# Patient Record
Sex: Female | Born: 1965 | Race: White | Hispanic: No | Smoking: Never smoker
Health system: Southern US, Community
[De-identification: ages and names within clinical notes are randomized; demographics above are authoritative.]

---

## 2000-07-16 ENCOUNTER — Other Ambulatory Visit: Admission: RE | Admit: 2000-07-16 | Discharge: 2000-07-16 | Payer: Self-pay | Admitting: Obstetrics and Gynecology

## 2001-12-14 ENCOUNTER — Ambulatory Visit (HOSPITAL_COMMUNITY): Admission: RE | Admit: 2001-12-14 | Discharge: 2001-12-14 | Payer: Self-pay | Admitting: Family Medicine

## 2001-12-14 ENCOUNTER — Encounter: Payer: Self-pay | Admitting: Family Medicine

## 2002-04-12 ENCOUNTER — Ambulatory Visit (HOSPITAL_COMMUNITY): Admission: RE | Admit: 2002-04-12 | Discharge: 2002-04-12 | Payer: Self-pay | Admitting: Obstetrics and Gynecology

## 2002-08-20 ENCOUNTER — Emergency Department (HOSPITAL_COMMUNITY): Admission: EM | Admit: 2002-08-20 | Discharge: 2002-08-20 | Payer: Self-pay | Admitting: *Deleted

## 2003-02-17 ENCOUNTER — Inpatient Hospital Stay (HOSPITAL_COMMUNITY): Admission: RE | Admit: 2003-02-17 | Discharge: 2003-02-20 | Payer: Self-pay | Admitting: Obstetrics and Gynecology

## 2015-03-30 ENCOUNTER — Emergency Department (HOSPITAL_COMMUNITY): Payer: BC Managed Care – PPO

## 2015-03-30 ENCOUNTER — Emergency Department (HOSPITAL_COMMUNITY)
Admission: EM | Admit: 2015-03-30 | Discharge: 2015-03-30 | Disposition: A | Discharge status: Home / Self Care | Payer: BC Managed Care – PPO | Attending: Emergency Medicine | Admitting: Emergency Medicine

## 2015-03-30 ENCOUNTER — Encounter (HOSPITAL_COMMUNITY): Payer: Self-pay | Admitting: Emergency Medicine

## 2015-03-30 DIAGNOSIS — R202 Paresthesia of skin: Secondary | ICD-10-CM | POA: Diagnosis not present

## 2015-03-30 DIAGNOSIS — Z880 Allergy status to penicillin: Secondary | ICD-10-CM | POA: Diagnosis not present

## 2015-03-30 DIAGNOSIS — R200 Anesthesia of skin: Secondary | ICD-10-CM | POA: Diagnosis present

## 2015-03-30 DIAGNOSIS — Z79899 Other long term (current) drug therapy: Secondary | ICD-10-CM | POA: Insufficient documentation

## 2015-03-30 DIAGNOSIS — R42 Dizziness and giddiness: Secondary | ICD-10-CM | POA: Insufficient documentation

## 2015-03-30 LAB — BASIC METABOLIC PANEL
Anion gap: 7 (ref 5–15)
BUN: 15 mg/dL (ref 6–20)
CALCIUM: 9.2 mg/dL (ref 8.9–10.3)
CO2: 28 mmol/L (ref 22–32)
CREATININE: 0.64 mg/dL (ref 0.44–1.00)
Chloride: 103 mmol/L (ref 101–111)
GFR calc non Af Amer: >60 mL/min (ref >60)
GLUCOSE: 91 mg/dL (ref 65–99)
Potassium: 4.2 mmol/L (ref 3.5–5.1)
Sodium: 138 mmol/L (ref 135–145)

## 2015-03-30 LAB — URINALYSIS, ROUTINE W REFLEX MICROSCOPIC
BILIRUBIN URINE: NEGATIVE
Glucose, UA: NEGATIVE mg/dL
Hgb urine dipstick: NEGATIVE
KETONES UR: NEGATIVE mg/dL
NITRITE: NEGATIVE
Protein, ur: NEGATIVE mg/dL
pH: 6 (ref 5.0–8.0)

## 2015-03-30 LAB — URINE MICROSCOPIC-ADD ON: RBC / HPF: NONE SEEN RBC/hpf (ref 0–5)

## 2015-03-30 LAB — CBC
HEMATOCRIT: 40.5 % (ref 36.0–46.0)
Hemoglobin: 13.4 g/dL (ref 12.0–15.0)
MCH: 30.6 pg (ref 26.0–34.0)
MCHC: 33.1 g/dL (ref 30.0–36.0)
MCV: 92.5 fL (ref 78.0–100.0)
Platelets: 182 10*3/uL (ref 150–400)
RBC: 4.38 MIL/uL (ref 3.87–5.11)
RDW: 14.1 % (ref 11.5–15.5)
WBC: 6.5 10*3/uL (ref 4.0–10.5)

## 2015-03-30 LAB — TROPONIN I: Troponin I: <0.03 ng/mL (ref <0.031)

## 2015-03-30 MED ORDER — SODIUM CHLORIDE 0.9 % IV BOLUS (SEPSIS)
1000.0000 mL | Freq: Once | INTRAVENOUS | Status: AC
  Administered 2015-03-30: 1000 mL via INTRAVENOUS

## 2015-03-30 MED ORDER — MECLIZINE HCL 12.5 MG PO TABS
25.0000 mg | ORAL_TABLET | Freq: Once | ORAL | Status: AC
  Administered 2015-03-30: 25 mg via ORAL
  Filled 2015-03-30: qty 2

## 2015-03-30 MED ORDER — MECLIZINE HCL 25 MG PO TABS: 25.0000 mg | ORAL_TABLET | Freq: Three times a day (TID) | ORAL | Status: DC | PRN

## 2015-03-30 NOTE — ED Notes (Signed)
SBP 99. EDP aware. Additional liter bolus initiated.

## 2015-03-30 NOTE — ED Notes (Signed)
Pt ambulated in the hall well with no assistance aprox. 15 feet and back to room.

## 2015-03-30 NOTE — ED Notes (Signed)
Pt c/o some dizziness for last couple days but no dizziness today.

## 2015-03-30 NOTE — Discharge Instructions (Signed)
°Emergency Department Resource Guide °1) Find a Doctor and Pay Out of Pocket °Although you won't have to find out who is covered by your insurance plan, it is a good idea to ask around and get recommendations. You will then need to call the office and see if the doctor you have chosen will accept you as a new patient and what types of options they offer for patients who are self-pay. Some doctors offer discounts or will set up payment plans for their patients who do not have insurance, but you will need to ask so you aren't surprised when you get to your appointment. ° °2) Contact Your Local Health Department °Not all health departments have doctors that can see patients for sick visits, but many do, so it is worth a call to see if yours does. If you don't know where your local health department is, you can check in your phone book. The CDC also has a tool to help you locate your state's health department, and many state websites also have listings of all of their local health departments. ° °3) Find a Walk-in Clinic °If your illness is not likely to be very severe or complicated, you may want to try a walk in clinic. These are popping up all over the country in pharmacies, drugstores, and shopping centers. They're usually staffed by nurse practitioners or physician assistants that have been trained to treat common illnesses and complaints. They're usually fairly quick and inexpensive. However, if you have serious medical issues or chronic medical problems, these are probably not your best option. ° °No Primary Care Doctor: °- Call [facility] at  832-8000 - they can help you locate a primary care doctor that  accepts your insurance, provides certain services, etc. °- Physician Referral Service- 1-800-533-3463 ° °Chronic Pain Problems: °Organization         Address  Phone   Notes  °[facility]  (336) 297-2271 Patients need to be referred by their primary care doctor.  ° °Medication  Assistance: °Organization         Address  Phone   Notes  °[place] [facility] [address] °[place], [place] [address] (336) 641-8030 --Must be a resident of [place] °-- Must have NO insurance coverage whatsoever (no Medicaid/ Medicare, etc.) °-- The pt. MUST have a primary care doctor that directs their care regularly and follows them in the community °  °[facility]  (866) 331-1348   °[employer]  (888) 892-1162   ° °Agencies that provide inexpensive medical care: °Organization         Address  Phone   Notes  °[facility]  (336) 832-8035   °[facility]    (336) 832-7272   °[facility] [address] °[place], [place] [address] (336) 832-4777   °[facility] [address], °[place] (336) 271-4999   °[facility]    (336) 373-0678   °[facility]    (336) 272-1050   °[facility] ° [address], [place] Phone:  (336) 832-4444, Fax:  (336) 832-4440 Hours of Operation:  9 am - 6 pm, M-F.  Also accepts Medicaid/Medicare and self-pay.  °[facility] ° [address], [place] Phone: (336) [phone], Fax: (336) [phone]. Hours of Operation:  8:30 am - 5:30 pm, M-F.  Also accepts Medicaid and self-pay.  °[facility] [place] [address], [place] Phone: (336) 878-6027   °[facility] [address], [place], [place] (336)723-1848, Ext. 123 Mondays & Thursdays: 7-9 AM.  First 15 patients are seen on a first come, first serve basis. °  ° °Medicaid-accepting [place] Providers: ° °Organization         Address  Phone   Notes  °[facility] [address], [place] (336) 641-2100 Also accepts self-pay patients.  °[facility] [address], [place] ° (336) 856-9996   °[facility] [address], [place]  (336) 288-8857   °[facility] [address] [place] [address], [place] (336) 299-7000   °Veita Bland [address], [place]  ° (336) 373-1557 Only accepts [place] Access Medicaid patients after they have their name applied to their card.  ° °Self-Pay (no insurance) in [place]: ° °Organization         Address  Phone   Notes  °Sickle Cell Patients, [facility] [address], [place] (336) 832-1970   °[facility] [address], [place] (336) 832-4400   °[facility] ° [address], [place] (336) 992-4800   °[facility]/Dr. Osei-Bonsu ° [address] [place] [address], [place] or [address], [place] (336) [phone] Phone number for both [place] and [place] locations is the same.  °[facility] [address], [place] (336) 299-0000   °[facility] [address] [place] [address], [place] or [address] (336) 852-7530 °(336) 878-2260   °[facility] [address], [place] (336) 350-1642, phone; (336) 294-5005, fax Sees patients 1st and 3rd Saturday of every month.  Must not qualify for public or private insurance (i.e. Medicaid, Medicare, [place] Health Choice, Veterans' Benefits) • Household income should be no more than 200% of the poverty level •The clinic cannot treat you if you are pregnant or think you are pregnant • Sexually transmitted diseases are not treated at the clinic.  ° ° °Dental Care: °Organization         Address  Phone  Notes  °[place] [facility] [address], [place] (336) 641-6152 Accepts children up to age 21 who are enrolled in Medicaid or [place] Health Choice; pregnant women with a Medicaid card; and children who have applied for Medicaid or [place] Health Choice, but were declined, whose parents can pay a reduced fee at time of service.  °[place]  [facility] [place]  [address], [place] (336) 641-7733 Accepts children up to age 21 who are enrolled in Medicaid or [place] Health Choice; pregnant women with a Medicaid card; and children who have applied for Medicaid or [place] Health Choice, but were declined, whose parents can pay a reduced fee at time of service.  °[facility] ° [address], [place] (336) 641-4533 Patients are seen by appointment only. Walk-ins are not accepted. [facility] will see patients 18 years of age and older. °Monday - Tuesday (8am-5pm) °Most Wednesdays (8:30-5pm) °$30 per visit, cash only  °[facility] ° [address], [place] (336) 641-4533 Patients are seen by appointment only. Walk-ins are not accepted. [facility] will see patients 18 years of age and older. °One   Wednesday Evening (Monthly: Volunteer Based).  $30 per visit, cash only  °[facility]  (919) 537-3737 for adults; Children under age 4, call Graduate Pediatric Dentistry at ([phone]) [phone]. Children aged 4-14, please call ([phone]) [phone] to request a pediatric application. ° Dental services are provided in all areas of dental care including fillings, crowns and bridges, complete and partial dentures, implants, gum treatment, root canals, and extractions. Preventive care is also provided. Treatment is provided to both adults and children. °Patients are selected via a lottery and there is often a waiting list. °  °[facility] [address], °[place] ° (336) 763-8833 [email or web address] °  °[facility] [address], [place], [place] (336)723-1848, Ext. 123 Second and Fourth Thursday of each month, opens at 6:30 AM; Clinic ends at 9 AM.  Patients are seen on a first-come first-served basis, and a limited number are seen during each clinic.  ° °[facility] ° [address], [place], [place] (336) 723-7904    Eligibility Requirements °You must have lived in [place], [place], or [place] counties for at least the last three months. °  You cannot be eligible for state or federal sponsored healthcare insurance, including Veterans Administration, Medicaid, or Medicare. °  You generally cannot be eligible for healthcare insurance through your employer.  °  How to apply: °Eligibility screenings are held every Tuesday and Wednesday afternoon from 1:00 pm until 4:00 pm. You do not need an appointment for the interview!  °[facility] [address], [place], [place] 336-631-2330   °[facility]  336-342-8273   °[facility]  336-703-3100   °[facility]  336-570-6415   ° °Behavioral Health Resources in the Community: °Intensive Outpatient Programs °Organization         Address  Phone  Notes  °[facility] [address], [place], [place] 336-878-6098   °[facility] [address], [place], [place] 336-832-9800   °[facility] [address], [place], [place] ° 336-882-2125   °[facility] [address],  °[place], [place] 1-800-853-5163 or 336-641-4981   °Substance Abuse Resources °Organization         Address  Phone  Notes  °[facility]  336-882-2125   °[facility]  336-784-9470   °[facility]  336-285-9073   °[facility]  336-845-3988   °Residential & Outpatient Substance Abuse Program  1-800-659-3381   °Psychological Services °Organization         Address  Phone  Notes  °[facility]  336- 832-9600   °[facility]  336- 378-7881   °[facility] [address], [place] 1-800-853-5163 or 336-641-4981   ° °Mobile Crisis Teams °Organization         Address  Phone  Notes  °[facility], Mobile Crisis Care Unit  1-877-626-1772   °Assertive °[facility] ° [address]  [place], [place] 336-834-9664   °Sharon DeEsch [address] °[place] 336-554-5454   ° °Self-Help/Support Groups °Organization         Address  Phone             Notes  °[facility] of  - variety of support groups  336- 373-1402 Call for more information  °Narcotics Anonymous (NA), [facility] [address], °[place]  2 meetings at this location  ° °  Residential Treatment Programs Organization         Address  Phone  Notes  [facility] [address],    [place]  [phone]   [facility]  [address], [place] [phone], [place], [place] [phone]   [facility] [address] [place], [place] [phone] Admissions: 8am-3pm M-F  [facility] [address]    [place], [place] [phone]   [facility] [address], [place], [place] [phone]   [facility] [address]  [place], [place] [phone]   [facility] Intensive Outpatient [address], [place], [place] [phone]   [facility] [address]  [place], [place] [phone] or [phone]   [facility] [address] [place], [place] [phone] Accepts Medicaid  [facility] [address]  [place] [phone] Substance Abuse/Addiction Treatment   [facility]         Address  Phone  Notes  [facility]  [phone](888) (610)[phone]   Angie FavaJulie Brannon, PhD [address], [address] [place], [place]   ([phone](336) [phone] or [phone](336) [phone]   [facility]   [address] [place], [place] [phone](336) [phone]   [facility] [address], [place], [place] (606) [phone](336) [phone] Insurance/Medicaid/sponsorship through [facility] [address]                                    [place], [place] [phone](336) [phone] Therapy/tele-psych/case    [facility] [address]   [place], [place] [phone](336) [phone]    Dr. Lolly MustacheArfeen  [phone](336) [phone]   [facility] [place]  [employer] [facility] 1) [address], [place] 2) [address], [place] 3)  [address], [place] [phone](336) [phone]) [phone]  [phone](336) [phone]   [facility] Child Abuse Hotline [phone](336) [phone] or [phone](336) ([phone] (After Hours)      Take the prescription as directed.  Call your regular medical doctor and the ENT on Monday to schedule a follow up appointment within the next week. Call the Vascular doctor on Monday to schedule a follow up appointment within the next week regarding your finger sensation changes.  Return to the Emergency Department immediately sooner if worsening.

## 2015-03-30 NOTE — ED Notes (Addendum)
Pt reports right hand went cold and "white" 30 minutes ago. Pt still has tingling in right ring finger.  Pt alert and oriented. Pt has been dizzy for two days.

## 2015-03-30 NOTE — ED Notes (Addendum)
Pt states she has noticed that she has had 2-3 episodes of dizziness over the past 2-3 days, described as episodes of unsteadiness / off-balance. States she has checked her BP and it was low, unable to recall exactly what it was but states SBP was never below 100. Pt states 30 min. PTA arrival, her right ring finger became pale and was tingling which alarmed her. Pt is currently tearful, stating she is scared.

## 2015-03-30 NOTE — ED Provider Notes (Signed)
CSN: [id number]     Arrival date & time 03/30/15  1136 History   First MD Initiated Contact with Patient 03/30/15 1217     Chief Complaint  Patient presents with  . Numbness  . Dizziness      HPI  Pt was seen at 1225. Per pt, c/o sudden onset and resolution of multiple intermittent episodes of "dizziness" that began 2 to 3 days ago. Pt describes the dizziness as "everything was moving around" and "feeling off balance." Symptoms worsen when she moves her eyes side to side. Pt also c/o her right ring finger "got pale and numb and cold" 30 min PTA. Pt states this lasted several seconds to minute before resolving. Denies CP/palpitations, no SOB/cough, no abd pain, no N/V/D, no visual changes, no focal motor weakness, no other tingling/numbness in extremities, no ataxia, no slurred speech, no facial droop.    No past medical history on file.   No past surgical history on file.  Social History  Substance Use Topics  . Smoking status: Never Smoker   . Smokeless tobacco: Not on file  . Alcohol Use: No    Review of Systems ROS: Statement: All systems negative except as marked or noted in the HPI; Constitutional: Negative for fever and chills. ; ; Eyes: Negative for eye pain, redness and discharge. ; ; ENMT: Negative for ear pain, hoarseness, nasal congestion, sinus pressure and sore throat. ; ; Cardiovascular: Negative for chest pain, palpitations, diaphoresis, dyspnea and peripheral edema. ; ; Respiratory: Negative for cough, wheezing and stridor. ; ; Gastrointestinal: Negative for nausea, vomiting, diarrhea, abdominal pain, blood in stool, hematemesis, jaundice and rectal bleeding. . ; ; Genitourinary: Negative for dysuria, flank pain and hematuria. ; ; Musculoskeletal: Negative for back pain and neck pain. Negative for swelling and trauma.; ; Skin: Negative for pruritus, rash, abrasions, blisters, bruising and skin lesion.; ; Neuro: +paresthesia, "dizziness." Negative for headache,  lightheadedness and neck stiffness. Negative for weakness, altered level of consciousness , altered mental status, extremity weakness, involuntary movement, seizure and syncope.      Allergies  Penicillins  Home Medications   Prior to Admission medications   Medication Sig Start Date End Date Taking? Authorizing Provider  Multiple Vitamins-Minerals (ONE-A-DAY WOMENS 50+ ADVANTAGE) TABS Take 1 Package by mouth daily.   Yes Historical Provider, MD  sodium chloride (OCEAN) 0.65 % SOLN nasal spray Place 1 spray into both nostrils as needed for congestion.   Yes Historical Provider, MD   BP 131/64 mmHg  Pulse 60  Temp(Src) 97.5 F (36.4 C) (Oral)  Resp 18  Ht  (1.727 m)  Wt 150 lb (68.04 kg)  BMI 22.81 kg/m2  SpO2 100%  LMP 01/29/2015 (Approximate)   12:59 Orthostatic Vital Signs KP  Orthostatic Lying  - BP- Lying: 116/64 mmHg ; Pulse- Lying: 57  Orthostatic Sitting - BP- Sitting: 115/65 mmHg ; Pulse- Sitting: 56  Orthostatic Standing at 0 minutes - BP- Standing at 0 minutes: 102/70 mmHg ; Pulse- Standing at 0 minutes: 71      Physical Exam 1230: Physical examination:  Nursing notes reviewed; Vital signs and O2 SAT reviewed;  Constitutional: Well developed, Well nourished, Well hydrated, In no acute distress; Head:  Normocephalic, atraumatic; Eyes: EOMI, PERRL, No scleral icterus; ENMT: TM's clear bilat. +edemetous nasal turbinates bilat with clear rhinorrhea. Mouth and pharynx normal, Mucous membranes moist; Neck: Supple, Full range of motion, No lymphadenopathy; Cardiovascular: Regular rate and rhythm, No murmur, rub, or gallop; Respiratory: Breath sounds clear &  equal bilaterally, No rales, rhonchi, wheezes.  Speaking full sentences with ease, Normal respiratory effort/excursion; Chest: Nontender, Movement normal; Abdomen: Soft, Nontender, Nondistended, Normal bowel sounds; Genitourinary: No CVA tenderness; Extremities: Pulses normal, +entire right ring finger warm with good  color, brisk cap refill in fingertip, FROM, NT to palp. No deformity, no rash, no edema, No calf edema or asymmetry.; Neuro: AA&Ox3, Major CN grossly intact. Speech clear.  No facial droop.  +right horizontal end gaze fatigable nystagmus. Grips equal. Strength 5/5 equal bilat UE's and LE's.  DTR 2/4 equal bilat UE's and LE's.  No gross sensory deficits.  Normal cerebellar testing bilat UE's (finger-nose) and LE's (heel-shin)..; Skin: Color normal, Warm, Dry.; Psych:  Anxious.    ED Course  Procedures (including critical care time) Labs Review   Imaging Review  I have personally reviewed and evaluated these images and lab results as part of my medical decision-making.   EKG Interpretation   Date/Time:  Friday March 30 2015 11:54:05 EST Ventricular Rate:  63 PR Interval:  126 QRS Duration: 108 QT Interval:  417 QTC Calculation: 427 R Axis:   68 Text Interpretation:  Sinus rhythm Atrial premature complex No old tracing  to compare Confirmed by [facility]  MD, Nicholos Johns [phone]) on 03/30/2015  12:38:40 PM      MDM  MDM Reviewed: previous chart, nursing note and vitals Reviewed previous: labs and ECG Interpretation: labs, ECG and CT scan      Results for orders placed or performed during the hospital encounter of 03/30/15  CBC  Result Value Ref Range   WBC 6.5 4.0 - 10.5 K/uL   RBC 4.38 3.87 - 5.11 MIL/uL   Hemoglobin 13.4 12.0 - 15.0 g/dL   HCT 30.8 65.7 - 84.6 %   MCV 92.5 78.0 - 100.0 fL   MCH 30.6 26.0 - 34.0 pg   MCHC 33.1 30.0 - 36.0 g/dL   RDW 96.2 95.2 - 84.1 %   Platelets 182 150 - 400 K/uL  Basic metabolic panel  Result Value Ref Range   Sodium 138 135 - 145 mmol/L   Potassium 4.2 3.5 - 5.1 mmol/L   Chloride 103 101 - 111 mmol/L   CO2 28 22 - 32 mmol/L   Glucose, Bld 91 65 - 99 mg/dL   BUN 15 6 - 20 mg/dL   Creatinine, Ser 3.24 0.44 - 1.00 mg/dL   Calcium 9.2 8.9 - 40.1 mg/dL   GFR calc non Af Amer >60 >60 mL/min   GFR calc Af Amer >60 >60 mL/min    Anion gap 7 5 - 15  Troponin I  Result Value Ref Range   Troponin I <0.03 <0.031 ng/mL  Urinalysis, Routine w reflex microscopic  Result Value Ref Range   Color, Urine YELLOW YELLOW   APPearance HAZY (A) CLEAR   Specific Gravity, Urine <1.005 (L) 1.005 - 1.030   pH 6.0 5.0 - 8.0   Glucose, UA NEGATIVE NEGATIVE mg/dL   Hgb urine dipstick NEGATIVE NEGATIVE   Bilirubin Urine NEGATIVE NEGATIVE   Ketones, ur NEGATIVE NEGATIVE mg/dL   Protein, ur NEGATIVE NEGATIVE mg/dL   Nitrite NEGATIVE NEGATIVE   Leukocytes, UA MODERATE (A) NEGATIVE  Urine microscopic-add on  Result Value Ref Range   Squamous Epithelial / LPF 6-30 (A) NONE SEEN   WBC, UA 6-30 0 - 5 WBC/hpf   RBC / HPF NONE SEEN 0 - 5 RBC/hpf   Bacteria, UA RARE (A) NONE SEEN   Ct Head Wo Contrast 03/30/2015  CLINICAL DATA:  Dizziness for 3 days. EXAM: CT HEAD WITHOUT CONTRAST TECHNIQUE: Contiguous axial images were obtained from the base of the skull through the vertex without intravenous contrast. COMPARISON:  December 14, 2001 FINDINGS: The ventricles are normal in size and configuration. There is no intracranial mass, hemorrhage, extra-axial fluid collection, or midline shift. Gray-white compartments are normal. No acute infarct evident. The bony calvarium appears intact. The mastoid air cells are clear. Visualized intraorbital structures appear symmetric bilaterally. IMPRESSION: Study within normal limits. Electronically Signed   By: Bretta BangWilliam  Woodruff III M.D.   On: 03/30/2015 13:42    1450:  Pt orthostatic on VS; IVF given. Pt tol PO well without N/V. Pt has ambulated with steady gait, easy resps, NAD. Right ring finger continues warm/good color/brisk cap refill in tip. Pt feels better after antivert and wants to go home now. Tx symptomatically.  Dx and testing d/w pt and family.  Questions answered.  Verb understanding, agreeable to d/c home with outpt f/u.    Samuel JesterKathleen Lenzi Marmo, DO 04/01/15 1221

## 2015-04-03 ENCOUNTER — Other Ambulatory Visit (HOSPITAL_COMMUNITY): Payer: Self-pay | Admitting: Family Medicine

## 2015-04-03 DIAGNOSIS — Z1231 Encounter for screening mammogram for malignant neoplasm of breast: Secondary | ICD-10-CM

## 2015-04-11 ENCOUNTER — Ambulatory Visit (HOSPITAL_COMMUNITY): Payer: BC Managed Care – PPO

## 2015-04-23 ENCOUNTER — Other Ambulatory Visit: Payer: Self-pay

## 2015-04-23 DIAGNOSIS — R202 Paresthesia of skin: Secondary | ICD-10-CM

## 2015-05-09 ENCOUNTER — Encounter: Payer: Self-pay | Admitting: Vascular Surgery

## 2015-05-15 ENCOUNTER — Ambulatory Visit (HOSPITAL_COMMUNITY)
Admission: RE | Admit: 2015-05-15 | Discharge: 2015-05-15 | Disposition: A | Discharge status: Home / Self Care | Payer: BC Managed Care – PPO | Source: Ambulatory Visit | Attending: Vascular Surgery | Admitting: Vascular Surgery

## 2015-05-15 ENCOUNTER — Other Ambulatory Visit: Payer: Self-pay | Admitting: Vascular Surgery

## 2015-05-15 ENCOUNTER — Encounter: Payer: Self-pay | Admitting: Vascular Surgery

## 2015-05-15 ENCOUNTER — Ambulatory Visit (INDEPENDENT_AMBULATORY_CARE_PROVIDER_SITE_OTHER): Payer: BC Managed Care – PPO | Admitting: Vascular Surgery

## 2015-05-15 VITALS — BP 105/68 | HR 70 | Ht 68.0 in | Wt 157.0 lb

## 2015-05-15 DIAGNOSIS — R202 Paresthesia of skin: Secondary | ICD-10-CM

## 2015-05-15 DIAGNOSIS — M79644 Pain in right finger(s): Secondary | ICD-10-CM | POA: Diagnosis not present

## 2015-05-15 NOTE — Progress Notes (Signed)
Referring Physician: Dr. Margretta Sidle History of Present Illness:  Patient is a 50 y.o. year old female who presents for evaluation of Her right hand.  She has had 2 episodes of numbness and tingling with bluish skin discoloration of her 4th digit on her right hand since Christmas.  Other medical problems include  does not have a problem list on file.  History reviewed. No pertinent past medical history.  She denise DM, HTN, HLD, and CAD.    History reviewed. No pertinent past surgical history.  She has had no surgeries to date.  Social History Social History  Substance Use Topics  . Smoking status: Never Smoker   . Smokeless tobacco: None  . Alcohol Use: No    Family History History reviewed. No pertinent family history.  Allergies  Allergies  Allergen Reactions  . Penicillins Other (See Comments)    Childhood allergy. Has patient had a PCN reaction causing immediate rash, facial/tongue/throat swelling, SOB or lightheadedness with hypotension: No Has patient had a PCN reaction causing severe rash involving mucus membranes or skin necrosis: No Has patient had a PCN reaction that required hospitalization No Has patient had a PCN reaction occurring within the last 10 years: No If all of the above answers are "NO", then may proceed with Cephalosporin use.      Current Outpatient Prescriptions  Medication Sig Dispense Refill  . Multiple Vitamins-Minerals (ONE-A-DAY WOMENS 50+ ADVANTAGE) TABS Take 1 Package by mouth daily.    . sodium chloride (OCEAN) 0.65 % SOLN nasal spray Place 1 spray into both nostrils as needed for congestion.    . meclizine (ANTIVERT) 25 MG tablet Take 1 tablet (25 mg total) by mouth 3 (three) times daily as needed for dizziness. (Patient not taking: Reported on 05/15/2015) 15 tablet 0   No current facility-administered medications for this visit.    ROS:   General:  No weight loss, Fever, chills  HEENT: No recent headaches, no nasal bleeding,  no visual changes, no sore throat  Neurologic: No dizziness, blackouts, seizures. No recent symptoms of stroke or mini- stroke. No recent episodes of slurred speech, or temporary blindness.  Cardiac: No recent episodes of chest pain/pressure, no shortness of breath at rest.  No shortness of breath with exertion.  Denies history of atrial fibrillation or irregular heartbeat  Vascular: No history of rest pain in feet.  No history of claudication.  No history of non-healing ulcer, No history of DVT   Pulmonary: No home oxygen, no productive cough, no hemoptysis,  No asthma or wheezing  Musculoskeletal:   Arthritis,  Low back pain,   Joint pain  Hematologic:No history of hypercoagulable state.  No history of easy bleeding.  No history of anemia  Gastrointestinal: No hematochezia or melena,  No gastroesophageal reflux, no trouble swallowing  Urinary:  chronic Kidney disease,  on HD -  MWF or  TTHS,  Burning with urination,  Frequent urination,  Difficulty urinating;   Skin: No rashes  Psychological: No history of anxiety,  No history of depression   Physical Examination  Filed Vitals:   05/15/15 1409  BP: 105/68  Pulse: 70  Height:  (1.727 m)  Weight: 157 lb (71.215 kg)  SpO2: 100%    Body mass index is 23.88 kg/(m^2).  General:  Alert and oriented, no acute distress HEENT: Normal Neck: No bruit or JVD Pulmonary: Clear to auscultation bilaterally Cardiac: Regular Rate and  Rhythm without murmur Abdomen: Soft, non-tender, non-distended, no mass, no scars Skin: No rash, right hand all fingers appear normal in color with cap refill <2 sec., sensation intact.   Extremity Pulses:  2+ radial, brachial, femoral, dorsalis pedis, posterior tibial pulses bilaterally Musculoskeletal: No deformity or edema  Neurologic: Upper and lower extremity motor 5/5 and symmetric  DATA:   Arterial duplex ABI's Triphasic flow bilaterally with 4th digit pressures  noted to be normal.   ASSESSMENT:   Right ring finger 4th digit decreased sensation and cold bluish skin times 2 episodes  Normal triphasic arterial flow bilateral UE  PLAN:   She is not as risk of limb loss or digit loss.  Her arterial flow is completely normal on exam and via duplex study.   This could be explained by mechanical arterial vessel blockage, but the artrial duplex show no blockage.  There could have been 2 episodes of arterial spasm and complete recovery.  The last possibility to consider is Raynauds.  This usually would be affected by exposure to cold temps.  and would affect more than just one digit,  it would affect all 5 digits.  At this time she is not in any danger.  If she has progressive cold blue episodes we could per for an angiogram to look at there blood flow in the future.  She will F/U PRN.   Thomasena Edis EMMA [facility] PA-C [facility] [place] Office: [phone]  The patient was seen in conjunction with Dr. Arbie Cookey I have examined the patient, reviewed and agree with above. No evidence of embolic disease but also no evidence of vasospasm affecting specifically the fourth digit on her right hand. Noninvasive waveforms in all 10 digits completely normal. Explained the possibility of progressing to more standard ray knots phenomenon in the future. She was reassured and will see [place] should she develop recurrent problems. No evidence of repetitive motion trauma. Works as a Chartered loss adjuster.  Gretta Began, MD 05/15/2015 3:17 PM

## 2016-03-04 ENCOUNTER — Other Ambulatory Visit (HOSPITAL_COMMUNITY): Payer: Self-pay | Admitting: Family Medicine

## 2016-03-04 ENCOUNTER — Ambulatory Visit (HOSPITAL_COMMUNITY)
Admission: RE | Admit: 2016-03-04 | Discharge: 2016-03-04 | Disposition: A | Discharge status: Home / Self Care | Payer: BC Managed Care – PPO | Source: Ambulatory Visit | Attending: Family Medicine | Admitting: Family Medicine

## 2016-03-04 DIAGNOSIS — M545 Low back pain: Secondary | ICD-10-CM | POA: Diagnosis not present

## 2018-06-21 IMAGING — DX DG LUMBAR SPINE COMPLETE 4+V
5 series · 5 of 5 positions shown · non-contrast
Comparison: None.

CLINICAL DATA: Bilateral low back pain, no injury

EXAM:
LUMBAR SPINE - COMPLETE 4+ VIEW

[l-spine ap]
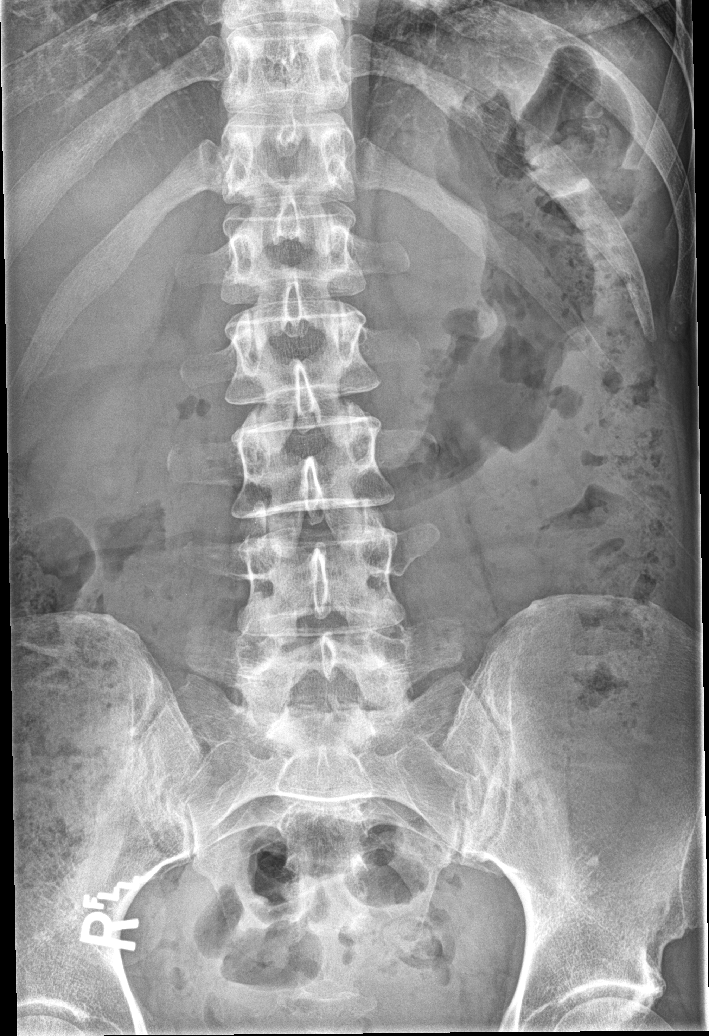

[l-spine lat]
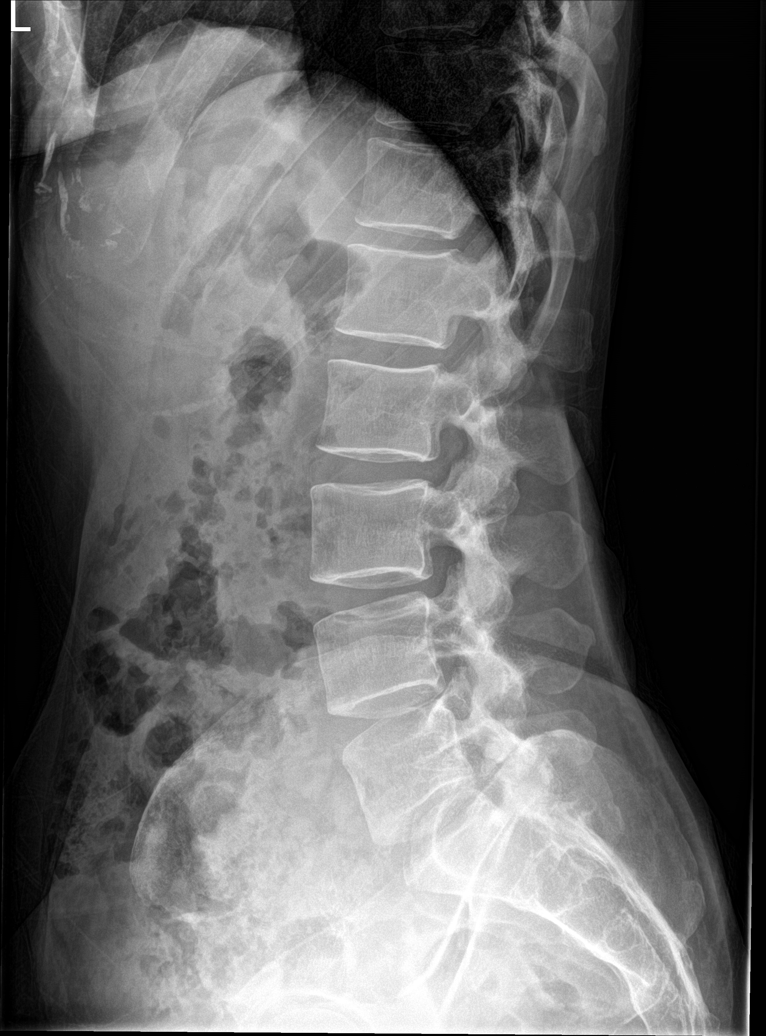

[l-spine spot]
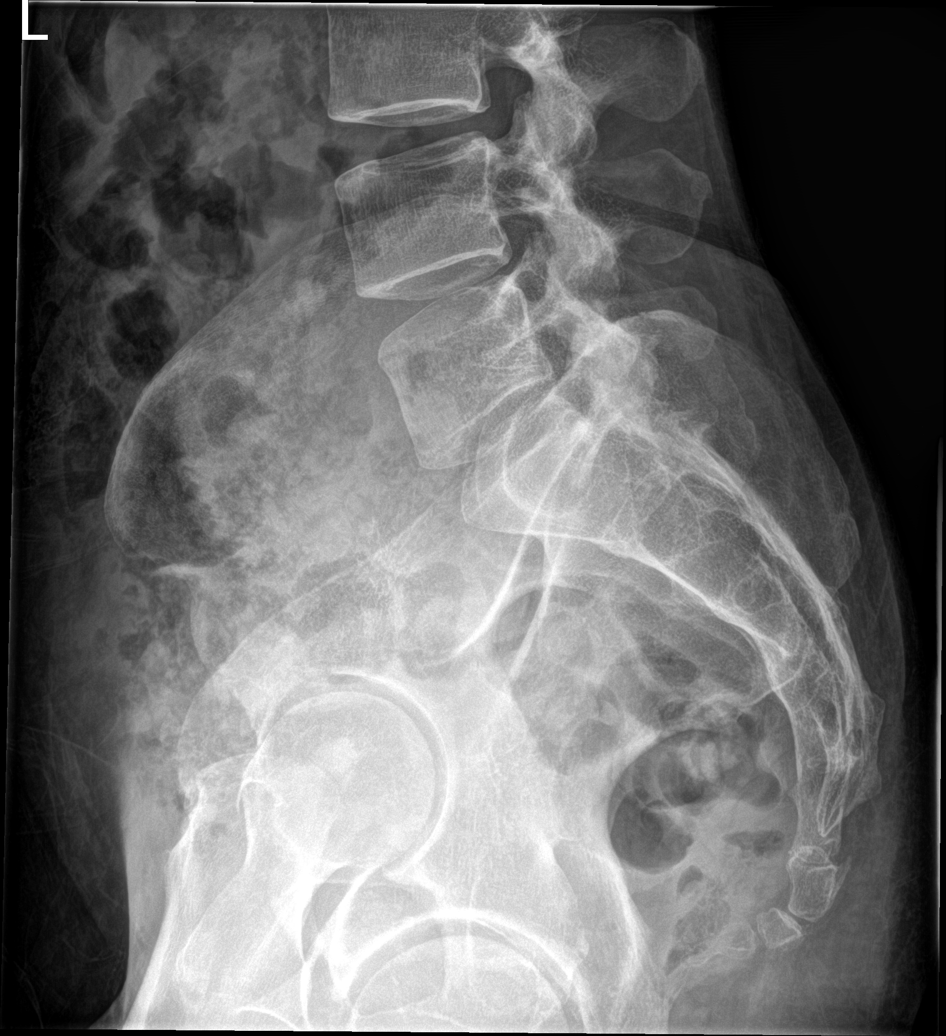

[l-spine obl (1 of 2)]
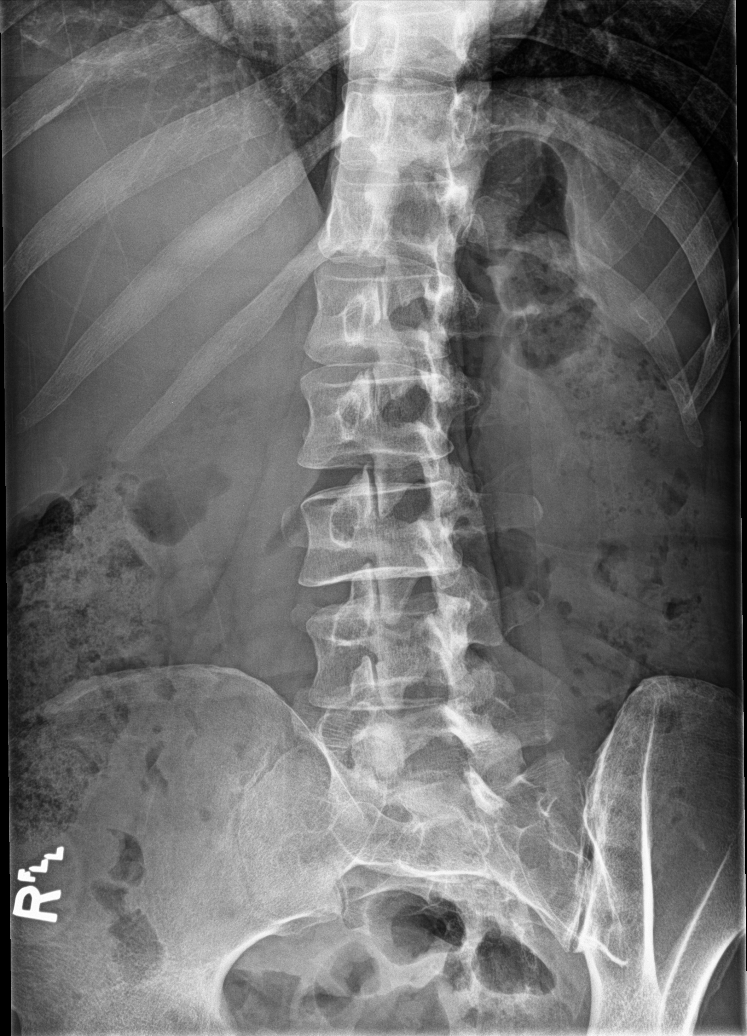

[l-spine obl (2 of 2)]
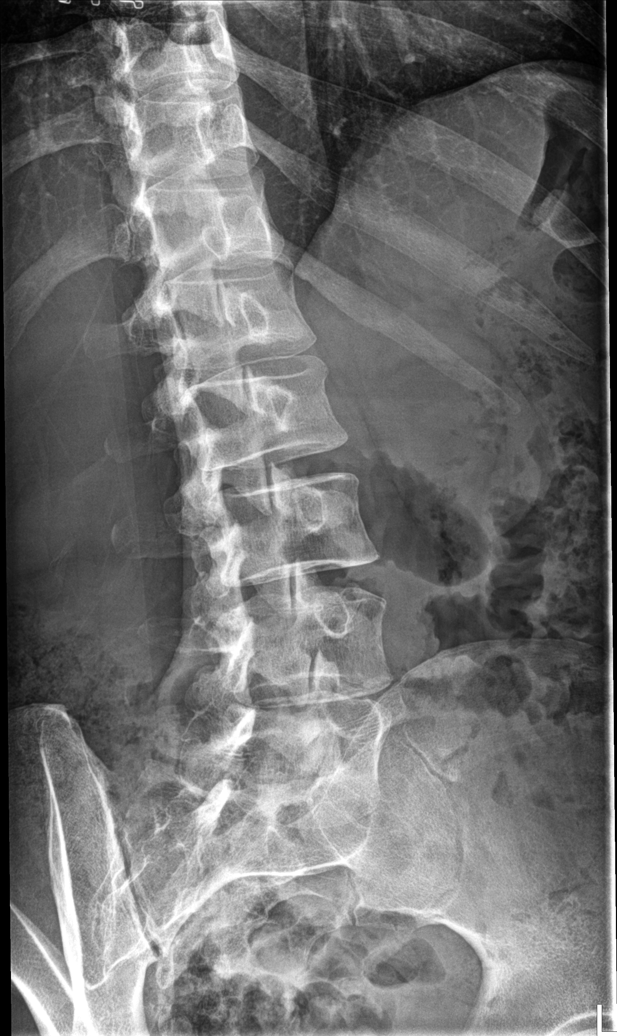

[5 of 5 positions shown; findings below may reference images not displayed]

FINDINGS: The lumbar vertebrae are in normal alignment. Intervertebral disc
spaces appear normal. No pars defect is seen. On oblique views the
facet joints are unremarkable. The SI joints are corticated. The
bowel gas pattern is nonspecific.
IMPRESSION: Normal alignment.  Normal intervertebral disc spaces.

## 2020-03-06 ENCOUNTER — Other Ambulatory Visit: Payer: Self-pay

## 2020-03-06 DIAGNOSIS — I8390 Asymptomatic varicose veins of unspecified lower extremity: Secondary | ICD-10-CM

## 2020-03-12 ENCOUNTER — Ambulatory Visit (HOSPITAL_COMMUNITY)
Admission: RE | Admit: 2020-03-12 | Discharge: 2020-03-12 | Disposition: A | Discharge status: Home / Self Care | Payer: BC Managed Care – PPO | Source: Ambulatory Visit | Attending: Surgery | Admitting: Surgery

## 2020-03-12 ENCOUNTER — Ambulatory Visit (INDEPENDENT_AMBULATORY_CARE_PROVIDER_SITE_OTHER): Payer: BC Managed Care – PPO | Admitting: Physician Assistant

## 2020-03-12 ENCOUNTER — Other Ambulatory Visit: Payer: Self-pay

## 2020-03-12 VITALS — BP 111/69 | HR 53 | Temp 98.9°F | Resp 20 | Ht 68.0 in | Wt 141.2 lb

## 2020-03-12 DIAGNOSIS — I8390 Asymptomatic varicose veins of unspecified lower extremity: Secondary | ICD-10-CM | POA: Diagnosis not present

## 2020-03-13 ENCOUNTER — Encounter: Payer: Self-pay | Admitting: Physician Assistant

## 2020-03-13 NOTE — Progress Notes (Signed)
Office Note     CC:  follow up Requesting Provider:  Gareth Morgan, MD  HPI: Christina Mullins is a 54 y.o. ([birth year]) female who presents for evaluation of varicose veins of bilateral lower extremities.  She was referred by PCP.  She is worried about her varicose veins.  These have been present over the past several years however believes they are getting worse.  She says her mother and her maternal grandmother also have varicose veins.  She denies any history of DVT, trauma, venous ulcerations, and prior vascular interventions.  She was prescribed compression stockings thigh-high by her PCP.  She was wearing these regularly however stopped recently because she was worried about the effects of the top of the compression stocking at the thigh digging into her varicosities.  She denies any episodes or diagnosis of superficial thrombophlebitis.  She has not tried elevating her feet during the day.  She denies tobacco use.   History reviewed. No pertinent past medical history.  History reviewed. No pertinent surgical history.  Social History   Socioeconomic History  . Marital status: Divorced    Spouse name: Not on file  . Number of children: Not on file  . Years of education: Not on file  . Highest education level: Not on file  Occupational History  . Not on file  Tobacco Use  . Smoking status: Never Smoker  Substance and Sexual Activity  . Alcohol use: No    Alcohol/week: 0.0 standard drinks  . Drug use: No  . Sexual activity: Not on file  Other Topics Concern  . Not on file  Social History Narrative  . Not on file   Social Determinants of Health   Financial Resource Strain:   . Difficulty of Paying Living Expenses: Not on file  Food Insecurity:   . Worried About Programme researcher, broadcasting/film/video in the Last Year: Not on file  . Ran Out of Food in the Last Year: Not on file  Transportation Needs:   . Lack of Transportation (Medical): Not on file  . Lack of Transportation  (Non-Medical): Not on file  Physical Activity:   . Days of Exercise per Week: Not on file  . Minutes of Exercise per Session: Not on file  Stress:   . Feeling of Stress : Not on file  Social Connections:   . Frequency of Communication with Friends and Family: Not on file  . Frequency of Social Gatherings with Friends and Family: Not on file  . Attends Religious Services: Not on file  . Active Member of Clubs or Organizations: Not on file  . Attends Banker Meetings: Not on file  . Marital Status: Not on file  Intimate Partner Violence:   . Fear of Current or Ex-Partner: Not on file  . Emotionally Abused: Not on file  . Physically Abused: Not on file  . Sexually Abused: Not on file   History reviewed. No pertinent family history.  Current Outpatient Medications  Medication Sig Dispense Refill  . Nutritional Supplements (IMMUNE ENHANCE PO) Take by mouth.    . Turmeric (QC TUMERIC COMPLEX) 500 MG CAPS Take by mouth.    Marland Kitchen UNABLE TO FIND Take by mouth daily. Med Name: Proargi-9+ L-arginine Complex     No current facility-administered medications for this visit.    Allergies  Allergen Reactions  . Penicillins Other (See Comments)    Childhood allergy. Has patient had a PCN reaction causing immediate rash, facial/tongue/throat swelling, SOB or lightheadedness with  hypotension: No Has patient had a PCN reaction causing severe rash involving mucus membranes or skin necrosis: No Has patient had a PCN reaction that required hospitalization No Has patient had a PCN reaction occurring within the last 10 years: No If all of the above answers are "NO", then may proceed with Cephalosporin use.      REVIEW OF SYSTEMS:   [X]  denotes positive finding, [ ]  denotes negative finding Cardiac  Comments:  Chest pain or chest pressure:    Shortness of breath upon exertion:    Short of breath when lying flat:    Irregular heart rhythm:        Vascular    Pain in calf, thigh, or  hip brought on by ambulation:    Pain in feet at night that wakes you up from your sleep:     Blood clot in your veins:    Leg swelling:         Pulmonary    Oxygen at home:    Productive cough:     Wheezing:         Neurologic    Sudden weakness in arms or legs:     Sudden numbness in arms or legs:     Sudden onset of difficulty speaking or slurred speech:    Temporary loss of vision in one eye:     Problems with dizziness:         Gastrointestinal    Blood in stool:     Vomited blood:         Genitourinary    Burning when urinating:     Blood in urine:        Psychiatric    Major depression:         Hematologic    Bleeding problems:    Problems with blood clotting too easily:        Skin    Rashes or ulcers:        Constitutional    Fever or chills:      PHYSICAL EXAMINATION:  Vitals:   03/12/20 1311  BP: 111/69  Pulse: (!) 53  Resp: 20  Temp: 98.9 F (37.2 C)  TempSrc: Temporal  SpO2: 100%  Weight: 141 lb 3.2 oz (64 kg)  Height: 5\' 8"  (1.727 m)    General:  WDWN in NAD; vital signs documented above Gait: Not observed HENT: WNL, normocephalic Pulmonary: normal non-labored breathing , without Rales, rhonchi,  wheezing Cardiac: regular HR Abdomen: soft, NT, no masses Skin: without rashes Vascular Exam/Pulses:  Right Left  Radial 2+ (normal) 2+ (normal)  DP 2+ (normal) 2+ (normal)  PT absent absent   Extremities: without ischemic changes, without Gangrene , without cellulitis; without open wounds;  Musculoskeletal: no muscle wasting or atrophy  Neurologic: A&O X 3;  No focal weakness or paresthesias are detected Psychiatric:  The pt has Normal affect.   Non-Invasive Vascular Imaging:   Bilateral venous reflux study R Negative for DVT Deep reflux noted at common femoral vein Superficial reflux noted at saphenofemoral junction and again at GSV proximal calf down to distal calf  L Negative for DVT Deep reflux noted at common femoral vein,  femoral vein, and popliteal vein Superficial reflux noted at GSV at the distal thigh   ASSESSMENT/PLAN:: 54 y.o. female here for evaluation of varicose veins of bilateral lower extremities   -Bilateral lower extremity venous reflux study negative for DVTs however demonstrates superficial reflux of right lower extremity involving the greater saphenous  vein as well as superficial reflux of left greater saphenous vein and deep reflux from common femoral vein down to popliteal vein -Despite reflux in bilateral greater saphenous veins the diameter of these veins is rather small less than 4 mm -Recommended patient continue wearing her thigh-high compression -Also recommended patient periodically elevate her legs during the day, avoid prolonged sitting and standing, and use NSAIDs for symptoms related to varicose veins -She has not had any episodes of superficial thrombophlebitis however will call/return to office if she experiences recurrent episodes and we could potentially consider stab phlebectomy.  Patient will otherwise follow-up on an as-needed basis   Emilie Rutter, PA-C [facility] [phone]  Clinic MD:   Myra Gianotti

## 2020-04-10 ENCOUNTER — Other Ambulatory Visit: Payer: Self-pay

## 2020-04-10 DIAGNOSIS — Z20822 Contact with and (suspected) exposure to covid-19: Secondary | ICD-10-CM

## 2020-04-13 LAB — NOVEL CORONAVIRUS, NAA: SARS-CoV-2, NAA: DETECTED — AB

## 2022-07-25 ENCOUNTER — Ambulatory Visit
Admission: RE | Admit: 2022-07-25 | Discharge: 2022-07-25 | Disposition: A | Discharge status: Home / Self Care | Payer: BC Managed Care – PPO | Source: Ambulatory Visit | Attending: Physician Assistant | Admitting: Physician Assistant

## 2022-07-25 ENCOUNTER — Ambulatory Visit (INDEPENDENT_AMBULATORY_CARE_PROVIDER_SITE_OTHER): Payer: BC Managed Care – PPO

## 2022-07-25 ENCOUNTER — Ambulatory Visit (HOSPITAL_COMMUNITY)
Admit: 2022-07-25 | Discharge: 2022-07-25 | Disposition: A | Discharge status: Home / Self Care | Payer: BC Managed Care – PPO | Attending: Physician Assistant | Admitting: Physician Assistant

## 2022-07-25 VITALS — BP 111/67 | HR 68 | Temp 98.5°F | Resp 20

## 2022-07-25 DIAGNOSIS — M7989 Other specified soft tissue disorders: Secondary | ICD-10-CM | POA: Insufficient documentation

## 2022-07-25 DIAGNOSIS — M79604 Pain in right leg: Secondary | ICD-10-CM

## 2022-07-25 NOTE — Discharge Instructions (Signed)
Your x-rays showed some dilated veins which was likely related to varicose veins did not show any other abnormalities.  I believe this is contributing to your symptoms but given you are also having pain behind your knee I would like to obtain an ultrasound to rule out a blood clot.  Please go have this done and we will contact you if it is positive.  Keep your leg elevated and use compression.  If you develop any increasing pain, swelling, chest pain, shortness of breath, heart racing you should be seen immediately.

## 2022-07-25 NOTE — ED Provider Notes (Signed)
[facility]    CSN: [id number] Arrival date & time: 07/25/22  1145      History   Chief Complaint Chief Complaint  Patient presents with   Foot Injury    I am having problems with the varicose veins in my foot. - Entered by patient    HPI Christina Mullins is a 57 y.o. female.   Patient presents today with a 3-day history of swelling on her medial right ankle.  She denies any recent falls or known trauma.  Symptoms did begin after she went [place] the zoo but denies any recent travel, immobilization, surgical procedure, COVID-19 diagnosis, active malignancy, exogenous hormone use.  Denies history of VTE event.  She does report some pain and swelling involving the back of her leg that is worse when she tries to ambulate.  Denies any chest pain, shortness of breath, palpitations.  She has not tried any over-the-counter medication for symptom management.  She does have a history of varicose veins and has been wearing compression stockings as previously recommended with minimal improvement of symptoms.      History reviewed. No pertinent past medical history.  There are no problems to display for this patient.   History reviewed. No pertinent surgical history.  OB History   No obstetric history on file.      Home Medications    Prior to Admission medications   Medication Sig Start Date End Date Taking? Authorizing Provider  Turmeric (QC TUMERIC COMPLEX) 500 MG CAPS Take by mouth.    [provider]  UNABLE TO FIND Take by mouth daily. Med Name: Proargi-9+ L-arginine Complex    [provider]    Family History History reviewed. No pertinent family history.  Social History Social History   Tobacco Use   Smoking status: Never  Substance Use Topics   Alcohol use: No    Alcohol/week: 0.0 standard drinks of alcohol   Drug use: No     Allergies   Penicillins   Review of Systems Review of Systems  Constitutional:  Positive for activity  change. Negative for appetite change, fatigue and fever.  Respiratory:  Negative for cough and shortness of breath.   Cardiovascular:  Positive for leg swelling. Negative for chest pain and palpitations.  Musculoskeletal:  Positive for arthralgias, gait problem and joint swelling. Negative for myalgias.  Neurological:  Negative for weakness and numbness.     Physical Exam Triage Vital Signs ED Triage Vitals  Enc Vitals Group     BP 07/25/22 1219 111/67     Pulse Rate 07/25/22 1219 68     Resp 07/25/22 1219 20     Temp 07/25/22 1219 98.5 F (36.9 C)     Temp Source 07/25/22 1219 Oral     SpO2 07/25/22 1219 97 %     Weight --      Height --      Head Circumference --      Peak Flow --      Pain Score 07/25/22 1222 0     Pain Loc --      Pain Edu? --      Excl. in GC? --    No data found.  Updated Vital Signs BP 111/67 (BP Location: Right Arm)   Pulse 68   Temp 98.5 F (36.9 C) (Oral)   Resp 20   SpO2 97%   Visual Acuity Right Eye Distance:   Left Eye Distance:   Bilateral Distance:  Right Eye Near:   Left Eye Near:    Bilateral Near:     Physical Exam Vitals reviewed.  Constitutional:      General: She is awake. She is not in acute distress.    Appearance: Normal appearance. She is well-developed. She is not ill-appearing.     Comments: Very pleasant female appears stated age in no acute distress sitting comfortably in exam room  HENT:     Head: Normocephalic and atraumatic.  Cardiovascular:     Rate and Rhythm: Normal rate and regular rhythm.     Heart sounds: Normal heart sounds, S1 normal and S2 normal. No murmur heard.    Comments: Capillary fill within 2 seconds right toes Pulmonary:     Effort: Pulmonary effort is normal.     Breath sounds: Normal breath sounds. No wheezing, rhonchi or rales.     Comments: Clear to auscultation bilaterally Abdominal:     Palpations: Abdomen is soft.     Tenderness: There is no abdominal tenderness.   Musculoskeletal:     Right lower leg: No edema.     Left lower leg: No edema.     Right ankle: Swelling and deformity present. Tenderness present over the medial malleolus.     Comments: Right leg: Tenderness palpation over medial malleoli that is more prominent in slightly anterior than typical.  Normal active range of motion of foot.  Foot neurovascularly intact.  No significant edema on exam.  Negative Homans' sign.  Patient does have tenderness in right popliteal fossa without cords.  Psychiatric:        Behavior: Behavior is cooperative.      UC Treatments / Results  Labs (all labs ordered are listed, but only abnormal results are displayed) Labs Reviewed - No data to display  EKG   Radiology DG Ankle Complete Right  Result Date: 07/25/2022 CLINICAL DATA:  Varicose veins with swelling of both feet for 3 days. EXAM: RIGHT ANKLE - COMPLETE 3+ VIEW COMPARISON:  None Available. FINDINGS: There is no acute fracture or dislocation. Bony alignment is normal. The joint spaces are preserved. There is soft tissue swelling over the medial malleolus extending up the calf with serpiginous likely dilated veins. IMPRESSION: 1. Soft tissue swelling over the medial malleolus extending up the calf with a serpiginous likely dilated veins. 2. No acute osseous abnormality. Electronically Signed   By: Lesia Hausen M.D.   On: 07/25/2022 13:37    Procedures Procedures (including critical care time)  Medications Ordered in UC Medications - No data to display  Initial Impression / Assessment and Plan / UC Course  I have reviewed the triage vital signs and the nursing notes.  Pertinent labs & imaging results that were available during my care of the patient were reviewed by me and considered in my medical decision making (see chart for details).     Patient is well-appearing, afebrile, nontoxic, nontachycardic.  X-ray was obtained given tenderness over medial malleolus which showed no acute osseous  abnormality but did show soft tissue swelling likely related to dilated veins.  We discussed that this is likely cause of her symptoms but because she is having swelling and pain in the popliteal fossa we will obtain ultrasound to rule out DVT.  She was encouraged to use conservative treatment measures including elevation, compression, over-the-counter analgesics.  We discussed that if she has any worsening symptoms including increasing pain, swelling, chest pain, shortness of breath, nausea/vomiting, weakness she needs to be seen immediately.  Strict return precautions given.  Final Clinical Impressions(s) / UC Diagnoses   Final diagnoses:  Right leg pain  Right leg swelling     Discharge Instructions      Your x-rays showed some dilated veins which was likely related to varicose veins did not show any other abnormalities.  I believe this is contributing to your symptoms but given you are also having pain behind your knee I would like to obtain an ultrasound to rule out a blood clot.  Please go have this done and we will contact you if it is positive.  Keep your leg elevated and use compression.  If you develop any increasing pain, swelling, chest pain, shortness of breath, heart racing you should be seen immediately.     ED Prescriptions   None    PDMP not reviewed this encounter.   Jeani Hawking, PA-C 07/25/22 1416

## 2022-07-25 NOTE — ED Triage Notes (Signed)
Pt reports she has severe varicose veins swelling both of her feet x 3 days. Swelling increased this morning.

## 2022-08-12 ENCOUNTER — Other Ambulatory Visit: Payer: Self-pay | Admitting: *Deleted

## 2022-08-12 DIAGNOSIS — I8393 Asymptomatic varicose veins of bilateral lower extremities: Secondary | ICD-10-CM

## 2022-08-21 ENCOUNTER — Ambulatory Visit (HOSPITAL_COMMUNITY)
Admission: RE | Admit: 2022-08-21 | Discharge: 2022-08-21 | Disposition: A | Discharge status: Home / Self Care | Payer: BC Managed Care – PPO | Source: Ambulatory Visit | Attending: Vascular Surgery | Admitting: Vascular Surgery

## 2022-08-21 ENCOUNTER — Ambulatory Visit: Payer: BC Managed Care – PPO | Admitting: Physician Assistant

## 2022-08-21 VITALS — BP 105/65 | HR 55 | Temp 97.5°F | Resp 18 | Ht 68.0 in | Wt 151.9 lb

## 2022-08-21 DIAGNOSIS — I8393 Asymptomatic varicose veins of bilateral lower extremities: Secondary | ICD-10-CM

## 2022-08-21 NOTE — Progress Notes (Signed)
[facility]           [place]  History and Physical   Christina Mullins is a 57 y.o. female who presents with bilateral varicosities with ankle pain.  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  She was seen in 2021 for evaluation of her varicosities.  She had varicosities of both legs.  She did have venous reflux but was not a candidate for laser ablation and was put in compression and instructed on leg elevation.  She presents back to day for re-evaluation.  She states that she continues to wear 20-69mmHg thigh high compression.  She states she feels they do help her legs but she is now getting some pain around her ankles at the end of the day.  She is a Personnel officer English as second language.  She move around quite a bit.  She does elevate her legs at night.  She does have family hx of varicose veins with her mother and grandmother.  She does not have hx of DVT.  Her spider veins have never bled.   The pt is not on a statin for cholesterol management.  The pt is not on a daily aspirin.   Other AC:  none The pt is not on medication for hypertension.   The pt is not on medication for diabetes.   Tobacco hx:  never  Pt does not have family hx of AAA.  PMH significant for varicose veins and leg swelling   Social History   Socioeconomic History   Marital status: Divorced    Spouse name: Not on file   Number of children: Not on file   Years of education: Not on file   Highest education level: Not on file  Occupational History   Not on file  Tobacco Use   Smoking status: Never   Smokeless tobacco: Not on file  Substance and Sexual Activity   Alcohol use: No    Alcohol/week: 0.0 standard drinks of alcohol   Drug use: No   Sexual activity: Not on file  Other Topics Concern   Not on file  Social History Narrative   Not on file   Social Determinants of Health   Financial Resource Strain: Not on file  Food Insecurity: Not on  file  Transportation Needs: Not on file  Physical Activity: Not on file  Stress: Not on file  Social Connections: Not on file  Intimate Partner Violence: Not on file    Family hx positive for varicose veins.  Negative for AAA  Current Outpatient Medications  Medication Sig Dispense Refill   Turmeric (QC TUMERIC COMPLEX) 500 MG CAPS Take by mouth.     UNABLE TO FIND Take by mouth daily. Med Name: Proargi-9+ L-arginine Complex     No current facility-administered medications for this visit.    Allergies  Allergen Reactions   Penicillins Other (See Comments)    Childhood allergy. Has patient had a PCN reaction causing immediate rash, facial/tongue/throat swelling, SOB or lightheadedness with hypotension: No Has patient had a PCN reaction causing severe rash involving mucus membranes or skin necrosis: No Has patient had a PCN reaction that required hospitalization No Has patient had a PCN reaction occurring within the last 10 years: No If all of the above answers are "NO", then may proceed with Cephalosporin use.     REVIEW OF SYSTEMS:   [X]  denotes positive finding, [ ]  denotes  negative finding Cardiac  Comments:  Chest pain or chest pressure:    Shortness of breath upon exertion:    Short of breath when lying flat:    Irregular heart rhythm:        Vascular    Pain in calf, thigh, or hip brought on by ambulation:    Pain in feet at night that wakes you up from your sleep:     Blood clot in your veins:    Leg swelling:  x       Pulmonary    Oxygen at home:    Productive cough:     Wheezing:         Neurologic    Sudden weakness in arms or legs:     Sudden numbness in arms or legs:     Sudden onset of difficulty speaking or slurred speech:    Temporary loss of vision in one eye:     Problems with dizziness:         Gastrointestinal    Blood in stool:     Vomited blood:         Genitourinary    Burning when urinating:     Blood in urine:        Psychiatric     Major depression:         Hematologic    Bleeding problems:    Problems with blood clotting too easily:        Skin    Rashes or ulcers:        Constitutional    Fever or chills:      PHYSICAL EXAMINATION:  Today's Vitals   08/21/22 1400 08/21/22 1401  BP: 105/65   Pulse: (!) 55   Resp: 18   Temp: (!) 97.5 F (36.4 C)   TempSrc: Oral   SpO2: 100%   Weight: 151 lb 14.4 oz (68.9 kg)   Height: 5\' 8"  (1.727 m)   PainSc:  5    Body mass index is 23.1 kg/m.   General:  WDWN in NAD; vital signs documented above Gait: Not observed HENT: WNL, normocephalic Pulmonary: normal non-labored breathing without wheezing Cardiac: regular HR; without carotid bruits Abdomen: soft, NT, aortic pulse is not palpable Skin: without rashes Vascular Exam/Pulses:  Right Left  Radial 2+ (normal) 2+ (normal)  DP 2+ (normal) 2+ (normal)   Extremities: large varicosites present in distal legs and ankles bilaterally with spider veins.   Neurologic: A&O X 3;  moving all extremities equally Psychiatric:  The pt has Normal affect.   Non-Invasive Vascular Imaging:   Venous duplex on 08/21/2022: Venous Reflux Times  +--------------+---------+------+-----------+------------+--------+  RIGHT        Reflux NoRefluxReflux TimeDiameter cmsComments                          Yes                                   +--------------+---------+------+-----------+------------+--------+  CFV                    yes   >1 second                       +--------------+---------+------+-----------+------------+--------+  FV mid                  yes   >  1 second                       +--------------+---------+------+-----------+------------+--------+  Popliteal    no                                              +--------------+---------+------+-----------+------------+--------+  GSV at [facility]              yes    >500 ms      0.61               +--------------+---------+------+-----------+------------+--------+  GSV prox thigh          yes    >500 ms      0.49              +--------------+---------+------+-----------+------------+--------+  GSV mid thigh           yes    >500 ms      0.25              +--------------+---------+------+-----------+------------+--------+  GSV dist thigh          yes    >500 ms      0.38              +--------------+---------+------+-----------+------------+--------+  GSV at knee             yes    >500 ms      0.32              +--------------+---------+------+-----------+------------+--------+  GSV prox calf           yes    >500 ms      0.36              +--------------+---------+------+-----------+------------+--------+  GSV mid calf            yes    >500 ms      0.40              +--------------+---------+------+-----------+------------+--------+  GSV dist calf no                            0.34              +--------------+---------+------+-----------+------------+--------+  SSV Pop Fossa no                            0.31              +--------------+---------+------+-----------+------------+--------+  SSV prox calf no                            0.21              +--------------+---------+------+-----------+------------+--------+  SSV mid calf  no                            0.24              +--------------+---------+------+-----------+------------+--------+  AASV o        no                            0.36              +--------------+---------+------+-----------+------------+--------+  AASV p        no                            0.25              +--------------+---------+------+-----------+------------+--------+   Summary:  Right:  - No evidence of deep vein thrombosis seen in the right lower extremity,  from the common femoral through the popliteal veins.  - No evidence of superficial venous reflux  seen in the right short  saphenous vein.  - Venous reflux is noted in the right common femoral vein.  - Venous reflux is noted in the right sapheno-femoral junction.  - Venous reflux is noted in the right greater saphenous vein in the thigh.  - Venous reflux is noted in the right greater saphenous vein in the calf.  - Venous reflux is noted in the right femoral vein.     Christina Mullins is a 57 y.o. female who presents with:  bilateral varicosities with ankle pain.    -pt has easily palpable DP pedal pulses bilaterally -pt does not have evidence of DVT.  Pt does have venous reflux in the deep venous system as well as the superficial system at the [facility] throughout the thigh down to mid calf, however, the vein is not adequate size for laser ablation.  -discussed with pt about continuing to wear thigh high 20-30 mmHg compression stockings and pt was measured for these today and did get a pair as she felt like her old ones were not the right size with some weight gain.  -discussed the importance of leg elevation and how to elevate properly - pt is advised to elevate their legs and a diagram is given to them to demonstrate for pt to lay flat on their back with knees elevated and slightly bent with their feet higher than their knees, which puts their feet higher than their heart for 15 minutes per day.  If pt cannot lay flat, advised to lay as flat as possible.  -pt is advised to continue as much walking as possible and avoid sitting or standing for long periods of time.  -discussed importance of  exercise and that water aerobics would also be beneficial.  -handout with recommendations given -pt will f/u as needed.  She knows to call if she has any issues.    Doreatha Massed, [facility] [phone]  Clinic MD:  Edilia Bo

## 2023-04-15 ENCOUNTER — Ambulatory Visit
Admission: RE | Admit: 2023-04-15 | Discharge: 2023-04-15 | Disposition: A | Discharge status: Home / Self Care | Payer: 59 | Source: Ambulatory Visit | Attending: Nurse Practitioner | Admitting: Nurse Practitioner

## 2023-04-15 VITALS — BP 118/78 | HR 55 | Temp 99.2°F | Resp 18

## 2023-04-15 DIAGNOSIS — M5441 Lumbago with sciatica, right side: Secondary | ICD-10-CM

## 2023-04-15 MED ORDER — PREDNISONE 20 MG PO TABS: 40.0000 mg | ORAL_TABLET | Freq: Every day | ORAL | 0 refills | Status: AC

## 2023-04-15 MED ORDER — CYCLOBENZAPRINE HCL 5 MG PO TABS: 5.0000 mg | ORAL_TABLET | Freq: Three times a day (TID) | ORAL | 0 refills | Status: DC | PRN

## 2023-04-15 NOTE — Discharge Instructions (Signed)
 Take medication as prescribed. Increase fluids and allow for plenty of rest. While you are taking the prednisone , you will need to take over-the-counter Tylenol arthritis strength 650 mg tablets for breakthrough pain.  When you complete the prednisone , you are able to resume use of Advil, ibuprofen, Aleve, or other NSAIDs.  Make sure you are taking medication with food and water to protect the stomach lining, as well as monitoring the intake to prevent damage to your kidneys. Perform stretching exercises at least 2-3 times daily.  I provided exercises for you to do at home. Try to remain as active as possible. Go to the emergency department immediately if you experience lower extremity weakness in which you cannot ambulate, loss of bowel or bladder function, or other concerns. As discussed, if symptoms continue to occur, provided information for orthopedics for you to follow-up with. Follow-up as needed.

## 2023-04-15 NOTE — ED Triage Notes (Signed)
 Pt reports she has pain on the right side of her tailbone  x 2 weeks.    Denies injury

## 2023-04-15 NOTE — ED Provider Notes (Signed)
 [facility]    CSN: [id number] Arrival date & time: 04/15/23  1638      History   Chief Complaint Chief Complaint  Patient presents with   Hip Pain    I am having pain in my right bun. It hurts also down the back of my upper leg when I bend over or if I'm sitting when I get up. It started about 2 weeks ago. - Entered by patient    HPI Christina Mullins is a 58 y.o. female.   The history is provided by the patient.   Patient presents for complaints of pain in the right buttock that runs down the right leg has been present for almost 2 weeks.  Patient denies any obvious known injury or trauma.  Patient reports pain worsens when she is bending over and when she is having a bowel movement.  Patient denies fever, chills, abdominal pain, change in bowel or bladder pattern, loss of bowel or bladder function, paresthesia, inability to ambulate, or lower extremity weakness.  Patient reports she has been taking over-the-counter Advil for symptoms with some relief.  Denies prior history of back pain.  History reviewed. No pertinent past medical history.  There are no active problems to display for this patient.   History reviewed. No pertinent surgical history.  OB History   No obstetric history on file.      Home Medications    Prior to Admission medications   Medication Sig Start Date End Date Taking? Authorizing Provider  cyclobenzaprine  (FLEXERIL ) 5 MG tablet Take 1 tablet (5 mg total) by mouth 3 (three) times daily as needed for muscle spasms. 04/15/23  Yes Leath-Warren, Etta PARAS, NP  predniSONE  (DELTASONE ) 20 MG tablet Take 2 tablets (40 mg total) by mouth daily with breakfast for 5 days. 04/15/23 04/20/23 Yes Leath-Warren, Etta PARAS, NP  Turmeric (QC TUMERIC COMPLEX) 500 MG CAPS Take by mouth.    [provider]  UNABLE TO FIND Take by mouth daily. Med Name: Proargi-9+ L-arginine Complex    [provider]    Family History History reviewed. No  pertinent family history.  Social History Social History   Tobacco Use   Smoking status: Never  Substance Use Topics   Alcohol use: No    Alcohol/week: 0.0 standard drinks of alcohol   Drug use: No     Allergies   Penicillins   Review of Systems Review of Systems   Physical Exam Triage Vital Signs ED Triage Vitals  Encounter Vitals Group     BP 04/15/23 1736 118/78     Systolic BP Percentile --      Diastolic BP Percentile --      Pulse Rate 04/15/23 1736 (!) 55     Resp 04/15/23 1736 18     Temp 04/15/23 1736 99.2 F (37.3 C)     Temp Source 04/15/23 1736 Oral     SpO2 04/15/23 1736 98 %     Weight --      Height --      Head Circumference --      Peak Flow --      Pain Score 04/15/23 1735 4     Pain Loc --      Pain Education --      Exclude from Growth Chart --    No data found.  Updated Vital Signs BP 118/78 (BP Location: Right Arm)   Pulse (!) 55   Temp 99.2 F (37.3 C) (Oral)  Resp 18   LMP 01/29/2015 (Approximate)   SpO2 98%   Visual Acuity Right Eye Distance:   Left Eye Distance:   Bilateral Distance:    Right Eye Near:   Left Eye Near:    Bilateral Near:     Physical Exam Vitals and nursing note reviewed.  Constitutional:      General: She is not in acute distress.    Appearance: Normal appearance.  Eyes:     Extraocular Movements: Extraocular movements intact.     Pupils: Pupils are equal, round, and reactive to light.  Musculoskeletal:     Cervical back: Normal range of motion.       Legs:  Lymphadenopathy:     Cervical: No cervical adenopathy.  Skin:    General: Skin is warm and dry.  Neurological:     General: No focal deficit present.     Mental Status: She is alert and oriented to person, place, and time.  Psychiatric:        Mood and Affect: Mood normal.        Behavior: Behavior normal.      UC Treatments / Results  Labs (all labs ordered are listed, but only abnormal results are displayed) Labs Reviewed -  No data to display  EKG   Radiology No results found.  Procedures Procedures (including critical care time)  Medications Ordered in UC Medications - No data to display  Initial Impression / Assessment and Plan / UC Course  I have reviewed the triage vital signs and the nursing notes.  Pertinent labs & imaging results that were available during my care of the patient were reviewed by me and considered in my medical decision making (see chart for details).  On exam, patient has point tenderness to the right buttock at the right sciatic nerve, symptoms consistent with low back pain with right-sided sciatica.  Will start patient on prednisone  40 mg for the next 5 days to help with sciatic nerve inflammation, and cyclobenzaprine  5 mg for low back pain.  Supportive care recommendations were provided and discussed with the patient to include use of over-the-counter Tylenol, gentle stretching exercises, and the use of ice or heat as needed.  Patient was given strict ER follow-up precautions.  Patient was in agreement with this plan of care and verbalized understanding.  All questions were answered.  Patient stable for discharge.  Final Clinical Impressions(s) / UC Diagnoses   Final diagnoses:  Midline low back pain with right-sided sciatica, unspecified chronicity     Discharge Instructions      Take medication as prescribed. Increase fluids and allow for plenty of rest. While you are taking the prednisone , you will need to take over-the-counter Tylenol arthritis strength 650 mg tablets for breakthrough pain.  When you complete the prednisone , you are able to resume use of Advil, ibuprofen, Aleve, or other NSAIDs.  Make sure you are taking medication with food and water to protect the stomach lining, as well as monitoring the intake to prevent damage to your kidneys. Perform stretching exercises at least 2-3 times daily.  I provided exercises for you to do at home. Try to remain as active  as possible. Go to the emergency department immediately if you experience lower extremity weakness in which you cannot ambulate, loss of bowel or bladder function, or other concerns. As discussed, if symptoms continue to occur, provided information for orthopedics for you to follow-up with. Follow-up as needed.     ED Prescriptions  Medication Sig Dispense Auth. Provider   predniSONE  (DELTASONE ) 20 MG tablet Take 2 tablets (40 mg total) by mouth daily with breakfast for 5 days. 10 tablet Leath-Warren, Etta PARAS, NP   cyclobenzaprine  (FLEXERIL ) 5 MG tablet Take 1 tablet (5 mg total) by mouth 3 (three) times daily as needed for muscle spasms. 30 tablet Leath-Warren, Etta PARAS, NP      PDMP not reviewed this encounter.   Gilmer Etta PARAS, NP 04/15/23 1755

## 2023-04-21 ENCOUNTER — Ambulatory Visit: Payer: Self-pay | Admitting: *Deleted

## 2023-04-21 NOTE — Telephone Encounter (Signed)
  Chief Complaint: lower back pain-R with sciatica  Symptoms: back pain after increased walking on field trip Frequency: 2 weeks- was seen at [facility] 04/14/22 and diagnosed with sciatica  Pertinent Negatives: Patient denies other symptoms Disposition: [] ED /[] Urgent Care (no appt availability in office) / [] Appointment(In office/virtual)/ []  [place] [facility]/ [] Home Care/ [] Refused Recommended Disposition /[] [facility]/ [x]  Follow-up with PCP Additional Notes: Patient does have NP appointment in March- advised per protocol- give a little more time- if not improving- be seen. Patient agrees with plan.

## 2023-04-21 NOTE — Telephone Encounter (Signed)
 Reason for Disposition  Back pain present > 2 weeks  Answer Assessment - Initial Assessment Questions 1. ONSET: When did the pain begin?      2 weeks 2. LOCATION: Where does it hurt? (upper, mid or lower back)     Hip- R buttock- down leg 3. SEVERITY: How bad is the pain?  (e.g., Scale 1-10; mild, moderate, or severe)   - MILD (1-3): Doesn't interfere with normal activities.    - MODERATE (4-7): Interferes with normal activities or awakens from sleep.    - SEVERE (8-10): Excruciating pain, unable to do any normal activities.      Mild/moderate 4. PATTERN: Is the pain constant? (e.g., yes, no; constant, intermittent)      Comes and goes 5. RADIATION: Does the pain shoot into your legs or somewhere else?     Into leg 6. CAUSE:  What do you think is causing the back pain?      UC- sciatica  7. BACK OVERUSE:  Any recent lifting of heavy objects, strenuous work or exercise?     Field trip recently- over use 8. MEDICINES: What have you taken so far for the pain? (e.g., nothing, acetaminophen, NSAIDS)     Tylenol, muscle relaxer  9. NEUROLOGIC SYMPTOMS: Do you have any weakness, numbness, or problems with bowel/bladder control?     no 10. OTHER SYMPTOMS: Do you have any other symptoms? (e.g., fever, abdomen pain, burning with urination, blood in urine)       no  Protocols used: Back Pain-A-AH

## 2023-05-07 ENCOUNTER — Other Ambulatory Visit (INDEPENDENT_AMBULATORY_CARE_PROVIDER_SITE_OTHER): Payer: 59

## 2023-05-07 ENCOUNTER — Ambulatory Visit: Payer: 59 | Admitting: Orthopaedic Surgery

## 2023-05-07 ENCOUNTER — Encounter: Payer: Self-pay | Admitting: Orthopaedic Surgery

## 2023-05-07 VITALS — Ht 68.0 in | Wt 150.0 lb

## 2023-05-07 DIAGNOSIS — M545 Low back pain, unspecified: Secondary | ICD-10-CM

## 2023-05-07 NOTE — Addendum Note (Signed)
Addended by: Rogers Seeds on: 05/07/2023 01:56 PM   Modules accepted: Orders

## 2023-05-07 NOTE — Progress Notes (Signed)
Office Visit Note   Patient: Christina Mullins           Date of Birth: 11-22-1965           MRN: [id number] Visit Date: 05/07/2023              Requested by: Juliette Alcide, MD [address] [place],  [place] [address] PCP: Juliette Alcide, MD   Assessment & Plan: Visit Diagnoses:  1. Acute right-sided low back pain, unspecified whether sciatica present           With right leg sciatica  Plan: She has had conservative treatment since her ER visit earlier this month.  Will set her up for some physical therapy recheck her in 3 weeks.  She is having persistent symptoms on return would recommend MRI scan with her sciatica and positive nerve root tension signs.  Collection was bothered her during the day and I will have her just take 2 of the 5 mg tablets Flexeril just at night she will call us if she needs a refill.  Follow-Up Instructions: Return in about 4 weeks (around 06/04/2023).   Orders:  Orders Placed This Encounter  Procedures   XR Lumbar Spine 2-3 Views   No orders of the defined types were placed in this encounter.     Procedures: No procedures performed   Clinical Data: No additional findings.   Subjective: Chief Complaint  Patient presents with   Lower Back - Pain    HPI 58 year old female with back pain right buttock pain that radiates into the right thigh to the right calf since Christmas.  She was seen in the ER [place] [facility] 04/15/2023.  Placed on Flexeril ,Robaxin, tumeric.  She is continue to have pain sometimes lives some problems sleeping at night Flexeril during the day made her feel drowsy.  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   Review of Systems no associated chills or fever no bowel bladder symptoms no previous back problems other than occasional minimal discomfort.   Objective: Vital Signs: Ht 5\' 8"  (1.727 m)   Wt 150 lb (68 kg)   LMP 01/29/2015 (Approximate)   BMI 22.81 kg/m   Physical Exam Constitutional:       Appearance: She is well-developed.  HENT:     Head: Normocephalic.     Right Ear: External ear normal.     Left Ear: External ear normal. There is no impacted cerumen.  Eyes:     Pupils: Pupils are equal, round, and reactive to light.  Neck:     Thyroid: No thyromegaly.     Trachea: No tracheal deviation.  Cardiovascular:     Rate and Rhythm: Normal rate.  Pulmonary:     Effort: Pulmonary effort is normal.  Abdominal:     Palpations: Abdomen is soft.  Musculoskeletal:     Cervical back: No rigidity.  Skin:    General: Skin is warm and dry.  Neurological:     Mental Status: She is alert and oriented to person, place, and time.  Psychiatric:        Behavior: Behavior normal.     Ortho Exam positive straight leg raising 70 degrees on the right negative on the left positive up to compression test.  She is able to heel and toe walk anterior tib EHL EDC FHL peroneals are strong.  Negative logroll hips.  No sciatic notch tenderness on the left side negative left positive compression test.  Specialty  Comments:  No specialty comments available.  Imaging: XR Lumbar Spine 2-3 Views Result Date: 05/07/2023 2 view lumbar x-rays obtained and reviewed.  Normal alignment lumbar spine.  Maintained disc space height.  No significant facet arthropathy and no spondylolisthesis. Impression: Normal lumbar radiographs.    PMFS History: Patient Active Problem List   Diagnosis Date Noted   Low back pain 05/07/2023   No past medical history on file.  No family history on file.  No past surgical history on file. Social History   Occupational History   Not on file  Tobacco Use   Smoking status: Never   Smokeless tobacco: Not on file  Substance and Sexual Activity   Alcohol use: No    Alcohol/week: 0.0 standard drinks of alcohol   Drug use: No   Sexual activity: Not on file

## 2023-05-19 ENCOUNTER — Ambulatory Visit (HOSPITAL_COMMUNITY): Payer: 59

## 2023-06-03 ENCOUNTER — Other Ambulatory Visit: Payer: Self-pay

## 2023-06-03 ENCOUNTER — Ambulatory Visit (HOSPITAL_COMMUNITY): Payer: 59 | Attending: Orthopaedic Surgery

## 2023-06-03 DIAGNOSIS — M25551 Pain in right hip: Secondary | ICD-10-CM | POA: Insufficient documentation

## 2023-06-03 DIAGNOSIS — M79604 Pain in right leg: Secondary | ICD-10-CM | POA: Diagnosis present

## 2023-06-03 DIAGNOSIS — M5459 Other low back pain: Secondary | ICD-10-CM | POA: Diagnosis present

## 2023-06-03 DIAGNOSIS — M545 Low back pain, unspecified: Secondary | ICD-10-CM | POA: Insufficient documentation

## 2023-06-03 NOTE — Therapy (Signed)
 OUTPATIENT PHYSICAL THERAPY THORACOLUMBAR EVALUATION   Patient Name: Christina Mullins MRN: [id number] DOB:02-11-1966, 58 y.o., female Today's Date: 06/03/2023  END OF SESSION:  PT End of Session - 06/03/23 0951     Visit Number 1    Number of Visits 5    Date for PT Re-Evaluation 07/01/23    Authorization Type [employer]    PT Start Time 0800    PT Stop Time 0840    PT Time Calculation (min) 40 min    Activity Tolerance Patient tolerated treatment well    Behavior During Therapy [facility] for tasks assessed/performed            No past medical history on file. No past surgical history on file. Patient Active Problem List   Diagnosis Date Noted   Low back pain 05/07/2023    PCP: Juliette Alcide,  MD  REFERRING PROVIDER: Eldred Manges, MD  REFERRING DIAG: M54.50 (ICD-10-CM) - Acute right-sided low back pain, unspecified whether sciatica present  Rationale for Evaluation and Treatment: Rehabilitation  THERAPY DIAG:  Pain in posterior right lower extremity  Pain in right hip  Other low back pain  ONSET DATE: December 2024  SUBJECTIVE:                                                                                                                                                                                           SUBJECTIVE STATEMENT: EVAL: Arrives to the clinic with pain on the R buttocks (see below). At times, patient reports that the legs are numb and weak. Condition started in December 2024 when patient did a lot of walking. Denies trauma. Since then, condition gradually got worse. Patient had X-ray (see below). Patient was given muscle relaxers which helps a little. Patient was then referred to outpatient PT evaluation and management.  PERTINENT HISTORY:  None on file  PAIN:  Are you having pain? Yes: NPRS scale: 2/10 Pain location: R buttocks to the R calf Pain description: ache, constant Aggravating factors: straining, sitting ~ 20  minutes Relieving factors: weight shifting in sitting, stretch (figure-of-4 stretch)  PRECAUTIONS: None  RED FLAGS: None   WEIGHT BEARING RESTRICTIONS: No  FALLS:  Has patient fallen in last 6 months? 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  DIAGNOSTIC FINDINGS:  05/07/23 2 view lumbar x-rays obtained and reviewed.  Normal alignment lumbar  spine.  Maintained disc space height.  No significant facet arthropathy  and no spondylolisthesis.   Impression: Normal lumbar radiographs.  PATIENT SURVEYS:  Modified Oswestry 18/50 = 36%   COGNITION: Overall cognitive status: Within functional limits for tasks assessed     SENSATION: WFL  MUSCLE LENGTH: Hamstrings: moderate restriction on B Thomas test: WFL on hip flexors Piriformis: mild restriction on R, mild restriction on L  POSTURE: rounded shoulders and forward head  PALPATION: Grade 1 tenderness on R buttocks area  LUMBAR ROM:   AROM eval  Flexion 50%*  Extension 100%  Right lateral flexion   Left lateral flexion   Right rotation 100%  Left rotation 75%   (Blank rows = not tested) *elicited radicular symtoms  LOWER EXTREMITY ROM:     Active  Right eval Left eval  Hip flexion [facility]  Hip extension [facility]  Hip abduction [facility]  Hip adduction    Hip internal rotation 30 35  Hip external rotation [facility]  Knee flexion [facility] WFL  Knee extension [facility] WFL  Ankle dorsiflexion [facility] WFL  Ankle plantarflexion [facility] WFL  Ankle inversion    Ankle eversion     (Blank rows =  not tested)  LOWER EXTREMITY MMT:    MMT Right eval Left eval  Hip flexion 4+ 4+  Hip extension 3+ 3+  Hip abduction 4- 4-  Hip adduction    Hip internal rotation    Hip external rotation    Knee flexion 5 5  Knee extension 4+ 5  Ankle dorsiflexion 5 5  Ankle plantarflexion 5 5  Ankle inversion    Ankle eversion     (Blank rows = not tested)  LUMBAR SPECIAL TESTS:  Straight leg raise test: positive on R, (+) Piriformis test on R  FUNCTIONAL TESTS:  5 times sit to stand: 14.21 sec 2 minute walk test: 456 ft  GAIT: Distance walked: 456 ft Assistive device utilized: None Level of assistance: Complete Independence Comments: done during , no gait deviations seen  TREATMENT DATE:  06/03/23 Evaluation and patient education done                                                                                                                               PATIENT EDUCATION:  Education details: Educated on the pathoanatomy of lower extremity nerve pain. Educated on the goals and course of rehab.  Person educated: Patient Education method: Explanation Education comprehension: verbalized understanding  HOME EXERCISE PROGRAM: None provided to date  ASSESSMENT:  CLINICAL IMPRESSION: EVAL: Patient is a 58 y.o. female who was seen today for physical therapy evaluation and treatment for low back pain. Based on the objective findings, patient is possibly presenting s/sx of piriformis syndrome. Patient's condition is  further defined by difficulty with prolonged sitting due to pain, weakness, and decreased soft tissue extensibility. Skilled PT is required to address the impairments and functional limitations listed below.    OBJECTIVE IMPAIRMENTS: decreased activity tolerance, difficulty walking, decreased ROM, decreased strength, impaired flexibility, and pain.   ACTIVITY LIMITATIONS: carrying, lifting, bending, sitting, and standing  PARTICIPATION LIMITATIONS: meal prep,  cleaning, laundry, driving, shopping, community activity, occupation, and yard work  PERSONAL FACTORS:  none  are also affecting patient's functional outcome.   REHAB POTENTIAL: Good  CLINICAL DECISION MAKING: Stable/uncomplicated  EVALUATION COMPLEXITY: Low   GOALS: Goals reviewed with patient? Yes  SHORT TERM GOALS: Target date: 06/17/23  Pt will demonstrate indep in HEP to facilitate carry-over of skilled services and improve functional outcomes Goal status: INITIAL  LONG TERM GOALS: Target date: 07/01/23  Pt will demonstrate a decrease in modified ODI score by 13-14% to demonstrate significant improvement in ADLs Baseline: 36% Goal status: INITIAL  2.  Pt will decrease 5TSTS by at least 3 seconds in order to demonstrate clinically significant improvement in LE strength  Baseline: 14.21 Goal status: INITIAL  3.  Pt will increase by at least 40 ft in order to demonstrate clinically significant improvement in community ambulation Baseline: 456 ft Goal status: INITIAL  4.  Pt will demonstrate increase in LE strength to 4+/5 to facilitate ease and safety in ambulation Baseline: 3+/5 Goal status: INITIAL  5.  Pt will be able to tolerate prolonged sitting > 30 minutes with slight pain (1-2/10) to facilitate ease in ADLs Baseline: see above Goal status: INITIAL  PLAN:  PT FREQUENCY: 1x/week  PT DURATION: 4 weeks  PLANNED INTERVENTIONS: 97164- PT Re-evaluation, 97110-Therapeutic exercises, 97530- Therapeutic activity, [id number]- Neuromuscular re-education, 97535- Self Care, 16109- Manual therapy, [id number]- Gait training, [phone]- Electrical stimulation (unattended), Patient/Family education, Dry Needling, Cryotherapy, and Moist heat.  PLAN FOR NEXT SESSION: Provide HEP. Begin piriformis stretch, core and hip strengthening. May add nerve glides as needed.   Tish Frederickson. Pam Vanalstine, PT, DPT, OCS Board-Certified Clinical Specialist in Orthopedic PT PT Compact Privilege # (Neeses):  [id number] T 06/03/2023, 9:53 AM

## 2023-06-04 ENCOUNTER — Ambulatory Visit: Payer: 59 | Admitting: Orthopaedic Surgery

## 2023-06-10 ENCOUNTER — Ambulatory Visit (HOSPITAL_COMMUNITY): Payer: 59 | Attending: Orthopaedic Surgery | Admitting: Physical Therapy

## 2023-06-10 DIAGNOSIS — M25551 Pain in right hip: Secondary | ICD-10-CM | POA: Insufficient documentation

## 2023-06-10 DIAGNOSIS — M79604 Pain in right leg: Secondary | ICD-10-CM | POA: Diagnosis present

## 2023-06-10 DIAGNOSIS — M5459 Other low back pain: Secondary | ICD-10-CM | POA: Diagnosis present

## 2023-06-10 NOTE — Therapy (Signed)
 OUTPATIENT PHYSICAL THERAPY THORACOLUMBAR EVALUATION   Patient Name: Christina Mullins MRN: [id number] DOB:July 07, 1965, 58 y.o., female Today's Date: 06/10/2023  END OF SESSION:  PT End of Session - 06/10/23 1555     Visit Number 2    Number of Visits 5    Date for PT Re-Evaluation 07/01/23    Authorization Type [employer] (no Berkley Harvey)    PT Start Time [phone]   PT Stop Time 1559    PT Time Calculation (min) 38 min    Activity Tolerance Patient tolerated treatment well    Behavior During Therapy [facility] for tasks assessed/performed            No past medical history on file. No past surgical history on file. Patient Active Problem List   Diagnosis Date Noted   Low back pain 05/07/2023    PCP: Juliette Alcide,  MD  REFERRING PROVIDER: Eldred Manges, MD  REFERRING DIAG: M54.50 (ICD-10-CM) - Acute right-sided low back pain, unspecified whether sciatica present  Rationale for Evaluation and Treatment: Rehabilitation  THERAPY DIAG:  Pain in posterior right lower extremity  Pain in right hip  Other low back pain  ONSET DATE: December 2024  SUBJECTIVE:                                                                                                                                                                                           SUBJECTIVE STATEMENT:  pt states that she does not have constant pain it's like a knott.   PERTINENT HISTORY:  None on file  PAIN:  Are you having pain? Yes: NPRS scale: 2/10 Pain location: R buttocks to the R calf Pain description: ache, constant Aggravating factors: straining, sitting ~ 20 minutes Relieving factors: weight shifting in sitting, stretch (figure-of-4 stretch)  PRECAUTIONS: None  RED FLAGS: None   WEIGHT BEARING RESTRICTIONS: No  FALLS:  Has patient fallen in last 6 months? 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  DIAGNOSTIC FINDINGS:  05/07/23 2 view lumbar x-rays obtained and reviewed.  Normal alignment lumbar  spine.  Maintained disc space height.  No significant facet arthropathy  and no spondylolisthesis.   Impression: Normal lumbar radiographs.  PATIENT SURVEYS:  Modified Oswestry 18/50 = 36%   COGNITION: Overall cognitive status: Within functional limits for tasks assessed     SENSATION: WFL  MUSCLE LENGTH: Hamstrings: moderate restriction on B Thomas test: WFL on hip flexors Piriformis: mild restriction on R, mild restriction on L  POSTURE: rounded shoulders and forward head  PALPATION: Grade 1 tenderness on R buttocks area  LUMBAR ROM:   AROM eval  Flexion 50%*  Extension 100%  Right lateral flexion   Left lateral flexion   Right rotation 100%  Left rotation 75%   (Blank rows = not tested) *elicited radicular symtoms  LOWER EXTREMITY ROM:     Active  Right eval Left eval  Hip flexion [facility]  Hip extension [facility]  Hip abduction [facility]  Hip adduction    Hip internal rotation 30 35  Hip external rotation [facility]  Knee flexion [facility] WFL  Knee extension [facility] WFL  Ankle dorsiflexion [facility] WFL  Ankle plantarflexion [facility] WFL  Ankle inversion    Ankle eversion     (Blank rows = not tested)  LOWER EXTREMITY MMT:    MMT Right eval Left eval  Hip flexion 4+ 4+  Hip extension 3+ 3+  Hip abduction 4- 4-  Hip adduction    Hip internal rotation    Hip external rotation    Knee flexion 5 5  Knee extension 4+ 5  Ankle dorsiflexion 5 5  Ankle plantarflexion 5 5  Ankle inversion    Ankle eversion     (Blank  rows = not tested)  LUMBAR SPECIAL TESTS:  Straight leg raise test: positive on R, (+) Piriformis test on R  FUNCTIONAL TESTS:  5 times sit to stand: 14.21 sec 2 minute walk test: 456 ft  GAIT: Distance walked: 456 ft Assistive device utilized: None Level of assistance: Complete Independence Comments: done during , no gait deviations seen  TREATMENT DATE:  06/03/23 Supine:  Knee to chest Rt 3x 30" Piriformis stretch 3x 30" Rt hamstring stretch 3x 30" followed by flossing x 10 Bridge with ab set x 10  Sidelying : Hip abduction x 10 B Clam x 10 B  Sit to stand x10 Nustep level 2 x 7 minutes                                                                                                                              PATIENT EDUCATION:  Education details: Educated on the pathoanatomy of lower extremity nerve pain. Educated on the goals and course of rehab.  Person educated: Patient Education method: Explanation Education comprehension: verbalized understanding  HOME EXERCISE PROGRAM: Access Code: [email or web address] URL: [email or web address] Date: 06/10/2023 Prepared by: Virgina Organ  Exercises - Supine Single Knee to Chest Stretch  - 2 x daily - 7 x weekly - 1 sets - 3 reps - 30" hold - Supine Piriformis Stretch with Leg Straight  - 2 x daily - 7 x weekly -  1 sets - 3 reps - 30" hold - Supine Hamstring Stretch  - 2 x daily - 7 x weekly - 1 sets - 3 reps - 30" hold - Supine Sciatic Nerve Glide  - 2 x daily - 7 x weekly - 1 sets - 3 reps - Supine Bridge  - 2 x daily - 7 x weekly - 3 sets - 10 reps  ASSESSMENT:  CLINICAL IMPRESSION:  Therapist reviewed evaluation as well as goals with patient.  Began a stretching program as well as beginning strengthening and gave pt HEP.   Patient continues to have sx of piriformis syndrome. Patient's condition is further defined by difficulty with prolonged sitting due to pain, weakness, and decreased soft tissue extensibility.   PT will benefit from skilled PT to  address the impairments and functional limitations listed below.    OBJECTIVE IMPAIRMENTS: decreased activity tolerance, difficulty walking, decreased ROM, decreased strength, impaired flexibility, and pain.   ACTIVITY LIMITATIONS: carrying, lifting, bending, sitting, and standing  PARTICIPATION LIMITATIONS: meal prep, cleaning, laundry, driving, shopping, community activity, occupation, and yard work  PERSONAL FACTORS:  none  are also affecting patient's functional outcome.   REHAB POTENTIAL: Good  CLINICAL DECISION MAKING: Stable/uncomplicated  EVALUATION COMPLEXITY: Low   GOALS: Goals reviewed with patient? Yes  SHORT TERM GOALS: Target date: 06/17/23  Pt will demonstrate indep in HEP to facilitate carry-over of skilled services and improve functional outcomes Goal status: on going   LONG TERM GOALS: Target date: 07/01/23  Pt will demonstrate a decrease in modified ODI score by 13-14% to demonstrate significant improvement in ADLs Baseline: 36% Goal status:  on going  2.  Pt will decrease 5TSTS by at least 3 seconds in order to demonstrate clinically significant improvement in LE strength  Baseline: 14.21 Goal status:  on going  3.  Pt will increase by at least 40 ft in order to demonstrate clinically significant improvement in community ambulation Baseline: 456 ft Goal status:  on going  4.  Pt will demonstrate increase in LE strength to 4+/5 to facilitate ease and safety in ambulation Baseline: 3+/5 Goal status:  on going  5.  Pt will be able to tolerate prolonged sitting > 30 minutes with slight pain (1-2/10) to facilitate ease in ADLs Baseline: see above Goal status:  on going  PLAN:  PT FREQUENCY: 1x/week  PT DURATION: 4 weeks  PLANNED INTERVENTIONS: 97164- PT Re-evaluation, 97110-Therapeutic exercises, 97530- Therapeutic activity, [id number]- Neuromuscular re-education, 97535- Self Care, 13086- Manual therapy, [id number]-  Gait training, [phone]- Electrical stimulation (unattended), Patient/Family education, Dry Needling, Cryotherapy, and Moist heat.  PLAN FOR NEXT SESSION: Provide HEP. Begin piriformis stretch, core and hip strengthening. May add nerve glides as needed.  Virgina Organ, PT CLT [phone] 06/10/2023, 3:56 PM

## 2023-06-17 ENCOUNTER — Encounter (HOSPITAL_COMMUNITY): Payer: 59 | Admitting: Physical Therapy

## 2023-06-19 ENCOUNTER — Telehealth (HOSPITAL_BASED_OUTPATIENT_CLINIC_OR_DEPARTMENT_OTHER): Payer: Self-pay | Admitting: Radiology

## 2023-06-19 ENCOUNTER — Other Ambulatory Visit: Payer: Self-pay | Admitting: Orthopaedic Surgery

## 2023-06-19 MED ORDER — CYCLOBENZAPRINE HCL 5 MG PO TABS: 5.0000 mg | ORAL_TABLET | Freq: Three times a day (TID) | ORAL | 0 refills | Status: AC | PRN

## 2023-06-19 NOTE — Telephone Encounter (Signed)
 Please see message from [place] office below and advise.   She called and is asking for a refill of her muscle relaxer med, Cyclobenzaprine 5 mg tab.  She original was prescribed this in the ER and she says Dr. Ophelia Charter told her he would refill if needed.  She uses [facility] in [place] and her phone number is [phone].

## 2023-06-19 NOTE — Telephone Encounter (Signed)
 I left voicemail advising medication has been sent to the pharmacy.

## 2023-06-22 ENCOUNTER — Encounter (HOSPITAL_COMMUNITY): Payer: 59 | Admitting: Physical Therapy

## 2023-07-01 ENCOUNTER — Ambulatory Visit (INDEPENDENT_AMBULATORY_CARE_PROVIDER_SITE_OTHER): Admitting: Orthopaedic Surgery

## 2023-07-01 ENCOUNTER — Encounter: Payer: Self-pay | Admitting: Orthopaedic Surgery

## 2023-07-01 VITALS — Ht 68.0 in | Wt 145.0 lb

## 2023-07-01 DIAGNOSIS — M545 Low back pain, unspecified: Secondary | ICD-10-CM

## 2023-07-01 NOTE — Progress Notes (Signed)
 Office Visit Note   Patient: Christina Mullins           Date of Birth: Dec 31, 1965           MRN: [id number] Visit Date: 07/01/2023              Requested by: Juliette Alcide, MD [address] [place],  [place] [address] PCP: Juliette Alcide, MD   Assessment & Plan: Visit Diagnoses:  1. Acute right-sided low back pain, unspecified whether sciatica present     Plan: Persistent back pain with sciatica now greater than 3 months.  She has been through therapy anti-inflammatories muscle relaxants without relief is questing MRI imaging at [facility].  She can follow-up in the [place] office and may be a candidate for single epidural steroid if she has just protrusion with lateral recess narrowing.  Follow-Up Instructions: No follow-ups on file.   Orders:  Orders Placed This Encounter  Procedures   MR Lumbar Spine w/o contrast   No orders of the defined types were placed in this encounter.     Procedures: No procedures performed   Clinical Data: No additional findings.   Subjective: Chief Complaint  Patient presents with   Lower Back - Pain, Follow-up    HPI 58 year old female returns with ongoing problems with back pain and right leg pain.  She states back pain is the same and right leg pain is increased now going down the right lateral calf.  I last saw her in January short physical therapy and states that therapy seems to be making the pain worse she is requesting an MRI.  She is using muscle relaxants to sleep.  More difficulty with changing positions in the morning.  She has not noticed problems with stairs no associated bowel or bladder symptoms.  Denies neck pain.  No claudication symptoms.  No left leg symptoms only right.  Review of Systems 14 point system update unchanged.   Objective: Vital Signs: Ht 5\' 8"  (1.727 m)   Wt 145 lb (65.8 kg)   LMP 01/29/2015 (Approximate)   BMI 22.05 kg/m   Physical Exam Constitutional:      Appearance: She is  well-developed.  HENT:     Head: Normocephalic.     Right Ear: External ear normal.     Left Ear: External ear normal. There is no impacted cerumen.  Eyes:     Pupils: Pupils are equal, round, and reactive to light.  Neck:     Thyroid: No thyromegaly.     Trachea: No tracheal deviation.  Cardiovascular:     Rate and Rhythm: Normal rate.  Pulmonary:     Effort: Pulmonary effort is normal.  Abdominal:     Palpations: Abdomen is soft.  Musculoskeletal:     Cervical back: No rigidity.  Skin:    General: Skin is warm and dry.  Neurological:     Mental Status: She is alert and oriented to person, place, and time.  Psychiatric:        Behavior: Behavior normal.     Ortho Exam negative straight leg raising on the left negative logroll right and left.  Straight leg raising 90 degrees on the right some discomfort popliteal compression right side only sciatic notch tenderness on the right.  Anterior tib EHL heel and toe walking is normal and symmetrical no upper extremity or lower extremity atrophy.  Normal pedal pulses.  Some decrease sensation lateral right calf.  Specialty Comments:  No specialty comments  available.  Imaging: No results found.   PMFS History: Patient Active Problem List   Diagnosis Date Noted   Low back pain 05/07/2023   No past medical history on file.  No family history on file.  No past surgical history on file. Social History   Occupational History   Not on file  Tobacco Use   Smoking status: Never   Smokeless tobacco: Not on file  Substance and Sexual Activity   Alcohol use: No    Alcohol/week: 0.0 standard drinks of alcohol   Drug use: No   Sexual activity: Not on file

## 2023-07-03 ENCOUNTER — Encounter (HOSPITAL_COMMUNITY): Payer: 59 | Admitting: Physical Therapy

## 2023-07-06 ENCOUNTER — Other Ambulatory Visit: Payer: Self-pay | Admitting: Radiology

## 2023-07-10 ENCOUNTER — Ambulatory Visit (HOSPITAL_COMMUNITY)

## 2024-02-08 ENCOUNTER — Encounter: Payer: Self-pay | Admitting: Radiology
